# Patient Record
Sex: Female | Born: 1980
Health system: Southern US, Community
[De-identification: ages and names within clinical notes are randomized; demographics above are authoritative.]

## PROBLEM LIST (undated history)

## (undated) DIAGNOSIS — Z807 Family history of other malignant neoplasms of lymphoid, hematopoietic and related tissues: Secondary | ICD-10-CM

## (undated) DIAGNOSIS — Z803 Family history of malignant neoplasm of breast: Secondary | ICD-10-CM

## (undated) DIAGNOSIS — Z800 Family history of malignant neoplasm of digestive organs: Secondary | ICD-10-CM

## (undated) HISTORY — DX: Family history of malignant neoplasm of breast: Z80.3

## (undated) HISTORY — PX: WISDOM TOOTH EXTRACTION: SHX21

## (undated) HISTORY — DX: Family history of malignant neoplasm of digestive organs: Z80.0

## (undated) HISTORY — DX: Family history of other malignant neoplasms of lymphoid, hematopoietic and related tissues: Z80.7

---

## 2014-07-12 ENCOUNTER — Encounter: Payer: Self-pay | Admitting: Family Medicine

## 2014-07-12 ENCOUNTER — Ambulatory Visit (INDEPENDENT_AMBULATORY_CARE_PROVIDER_SITE_OTHER): Payer: BLUE CROSS/BLUE SHIELD | Admitting: Family Medicine

## 2014-07-12 VITALS — BP 100/60 | HR 103 | Temp 99.9°F | Wt 125.6 lb

## 2014-07-12 DIAGNOSIS — J0100 Acute maxillary sinusitis, unspecified: Secondary | ICD-10-CM

## 2014-07-12 MED ORDER — AMOXICILLIN 875 MG PO TABS
875.0000 mg | ORAL_TABLET | Freq: Two times a day (BID) | ORAL | Status: DC
Start: 2014-07-12 — End: 2014-08-09

## 2014-07-12 NOTE — Progress Notes (Signed)
   Subjective:    Patient ID: Alexis Carpenter, female    DOB: 12-02-1980, 34 y.o.   MRN: [id number]  HPI She complains of a one-month history of intermittent nasal and sinus congestion, PND. In the last several days she has had difficulty with fever, chills, myalgias, sore throat and worsening PND Of purulent drainage. She does not smoke and has no underlying allergies. She is breast-feeding. Her child is now 25 months old. The child has been bringing home viral URIs consistently since being in daycare   Review of Systems     Objective:   Physical Exam Alert and in no distress. Tympanic membranes and canals are normal. Pharyngeal area is normal.Nasal mucosa is red. Tender especially over her maxillary sinuses. Neck is supple without adenopathy or thyromegaly. Cardiac exam shows a regular sinus rhythm without murmurs or gallops. Lungs are clear to auscultation.         Assessment & Plan:  Acute maxillary sinusitis, recurrence not specified - Plan: amoxicillin (AMOXIL) 875 MG tablet Encouraged her to call me if not entirely better she finishes the anti-biotic.

## 2014-07-12 NOTE — Patient Instructions (Signed)
Take all the antibiotic and if not totally back to normal when you finish call me °

## 2014-07-13 ENCOUNTER — Ambulatory Visit: Payer: Self-pay | Admitting: Family Medicine

## 2014-07-14 ENCOUNTER — Encounter: Payer: Self-pay | Admitting: Family Medicine

## 2014-07-14 ENCOUNTER — Ambulatory Visit (INDEPENDENT_AMBULATORY_CARE_PROVIDER_SITE_OTHER): Payer: BLUE CROSS/BLUE SHIELD | Admitting: Family Medicine

## 2014-07-14 ENCOUNTER — Telehealth: Payer: Self-pay | Admitting: Family Medicine

## 2014-07-14 VITALS — BP 110/60 | Temp 98.8°F

## 2014-07-14 DIAGNOSIS — B084 Enteroviral vesicular stomatitis with exanthem: Secondary | ICD-10-CM

## 2014-07-14 NOTE — Telephone Encounter (Signed)
Pt was advised to take Benadryl pills for itching however she wants to know if she can use Benadryl cream instead so that she does not get drowsy

## 2014-07-14 NOTE — Telephone Encounter (Signed)
Pt called and stated that she came in on Tuesday and was given amoxicillin. She now believes that she has hand, foot and month disease. She wanted to know if she could take benadryl for the itching. JCL was consulted and he stated she could could take benadryl and amoxicillin together. Pt was advised.

## 2014-07-14 NOTE — Telephone Encounter (Signed)
Pt coming in for ov per Dr Redmond School

## 2014-07-14 NOTE — Patient Instructions (Addendum)
Continue with good hand washing and use Benadryl at night and either Claritin or Allegra during the day. Cold compresses for the areas that really itch

## 2014-07-14 NOTE — Progress Notes (Signed)
   Subjective:    Patient ID: Alexis Carpenter, female    DOB: May 08, 1980, 34 y.o.   MRN: [id number]  HPI She is here for evaluation of a rash. Her son apparently has hand-foot mouth disease and she developed lesions on her hands and to a lesser extent feet. They are causing some itching. She has also been on Amoxil for treatment of an underlying sinus infection.   Review of Systems     Objective:   Physical Exam Alert and in no distress. Exam of the oropharynx does show some erythematous lesions on the soft palate. She also has erythematous macular lesions on the palms and to a lesser extent on her soles. No other lesions noted.       Assessment & Plan:  Hand, foot and mouth disease Continue with good hand washing and use Benadryl at night and either Claritin or Allegra during the day. Cold compresses for the areas that really itch

## 2014-08-09 ENCOUNTER — Encounter: Payer: Self-pay | Admitting: Family Medicine

## 2014-08-09 ENCOUNTER — Ambulatory Visit (INDEPENDENT_AMBULATORY_CARE_PROVIDER_SITE_OTHER): Payer: BLUE CROSS/BLUE SHIELD | Admitting: Family Medicine

## 2014-08-09 VITALS — BP 110/60 | HR 66 | Temp 98.2°F | Wt 128.0 lb

## 2014-08-09 DIAGNOSIS — J0100 Acute maxillary sinusitis, unspecified: Secondary | ICD-10-CM | POA: Diagnosis not present

## 2014-08-09 DIAGNOSIS — R319 Hematuria, unspecified: Secondary | ICD-10-CM

## 2014-08-09 LAB — POCT URINALYSIS DIPSTICK
Bilirubin, UA: NEGATIVE
Glucose, UA: NEGATIVE
Ketones, UA: NEGATIVE
NITRITE UA: NEGATIVE
PROTEIN UA: NEGATIVE
Spec Grav, UA: 1.015
UROBILINOGEN UA: NEGATIVE
pH, UA: 6

## 2014-08-09 MED ORDER — AMOXICILLIN 875 MG PO TABS: 875.0000 mg | ORAL_TABLET | Freq: Two times a day (BID) | ORAL | Status: DC

## 2014-08-09 NOTE — Progress Notes (Signed)
   Subjective:    Patient ID: Alexis Carpenter, female    DOB: 09/15/1980, 34 y.o.   MRN: [id number]  HPI  He has a one-day history of urgency and frequency and this morning saw blood in her urine. No fever, chills, abdominal pain. She is breast-feeding.   Review of Systems     Objective:   Physical Exam Alert and in no distress. Urine dipstick was positive       Assessment & Plan:  Hematuria - Plan: POCT Urinalysis Dipstick  Acute UTI - Plan: amoxicillin (AMOXIL) 875 MG tablet  she is to return here in 10 days for repeat urinalysis with the nurse.

## 2014-08-23 ENCOUNTER — Other Ambulatory Visit: Payer: BLUE CROSS/BLUE SHIELD

## 2015-11-02 ENCOUNTER — Inpatient Hospital Stay (HOSPITAL_COMMUNITY): Payer: BLUE CROSS/BLUE SHIELD | Admitting: Anesthesiology

## 2015-11-02 ENCOUNTER — Encounter (HOSPITAL_COMMUNITY)
Admission: AD | Disposition: A | Discharge status: Home / Self Care | Payer: Self-pay | Source: Ambulatory Visit | Attending: Obstetrics and Gynecology

## 2015-11-02 ENCOUNTER — Encounter (HOSPITAL_COMMUNITY): Payer: Self-pay

## 2015-11-02 ENCOUNTER — Ambulatory Visit (HOSPITAL_COMMUNITY)
Admission: AD | Admit: 2015-11-02 | Discharge: 2015-11-02 | Disposition: A | Discharge status: Home / Self Care | Payer: BLUE CROSS/BLUE SHIELD | Source: Ambulatory Visit | Attending: Obstetrics and Gynecology | Admitting: Obstetrics and Gynecology

## 2015-11-02 ENCOUNTER — Ambulatory Visit: Admit: 2015-11-02 | Payer: Self-pay | Admitting: Obstetrics and Gynecology

## 2015-11-02 DIAGNOSIS — O021 Missed abortion: Secondary | ICD-10-CM | POA: Diagnosis present

## 2015-11-02 HISTORY — PX: DILATION AND EVACUATION: SHX1459

## 2015-11-02 LAB — CBC
HEMATOCRIT: 35.8 % — AB (ref 36.0–46.0)
HEMOGLOBIN: 12.3 g/dL (ref 12.0–15.0)
MCH: 30.4 pg (ref 26.0–34.0)
MCHC: 34.4 g/dL (ref 30.0–36.0)
MCV: 88.4 fL (ref 78.0–100.0)
Platelets: 217 10*3/uL (ref 150–400)
RBC: 4.05 MIL/uL (ref 3.87–5.11)
RDW: 12.1 % (ref 11.5–15.5)
WBC: 5.5 10*3/uL (ref 4.0–10.5)

## 2015-11-02 LAB — TYPE AND SCREEN
ABO/RH(D): AB POS
Antibody Screen: NEGATIVE

## 2015-11-02 LAB — ABO/RH: ABO/RH(D): AB POS

## 2015-11-02 SURGERY — DILATION AND EVACUATION, UTERUS
Anesthesia: Choice

## 2015-11-02 SURGERY — DILATION AND EVACUATION, UTERUS
Anesthesia: Monitor Anesthesia Care | Site: Vagina

## 2015-11-02 MED ORDER — KETOROLAC TROMETHAMINE 30 MG/ML IJ SOLN: 30.0000 mg | Freq: Once | INTRAMUSCULAR | Status: DC

## 2015-11-02 MED ORDER — FENTANYL CITRATE (PF) 100 MCG/2ML IJ SOLN
INTRAMUSCULAR | Status: DC | PRN
  Administered 2015-11-02: 100 ug via INTRAVENOUS

## 2015-11-02 MED ORDER — LACTATED RINGERS IV BOLUS (SEPSIS): 1000.0000 mL | Freq: Once | INTRAVENOUS | Status: DC

## 2015-11-02 MED ORDER — LIDOCAINE HCL 1 % IJ SOLN
INTRAMUSCULAR | Status: AC
  Filled 2015-11-02: qty 20

## 2015-11-02 MED ORDER — DEXTROSE 5 % IV SOLN
200.0000 mg | Freq: Once | INTRAVENOUS | Status: DC
  Filled 2015-11-02: qty 200

## 2015-11-02 MED ORDER — LACTATED RINGERS IV SOLN
INTRAVENOUS | Status: DC
  Administered 2015-11-02: 15:00:00 via INTRAVENOUS

## 2015-11-02 MED ORDER — FENTANYL CITRATE (PF) 100 MCG/2ML IJ SOLN: 25.0000 ug | INTRAMUSCULAR | Status: DC | PRN

## 2015-11-02 MED ORDER — FAMOTIDINE IN NACL 20-0.9 MG/50ML-% IV SOLN
20.0000 mg | Freq: Once | INTRAVENOUS | Status: AC
  Administered 2015-11-02: 20 mg via INTRAVENOUS
  Filled 2015-11-02: qty 50

## 2015-11-02 MED ORDER — MEPERIDINE HCL 25 MG/ML IJ SOLN: 6.2500 mg | INTRAMUSCULAR | Status: DC | PRN

## 2015-11-02 MED ORDER — DEXAMETHASONE SODIUM PHOSPHATE 10 MG/ML IJ SOLN
INTRAMUSCULAR | Status: DC | PRN
  Administered 2015-11-02: 4 mg via INTRAVENOUS

## 2015-11-02 MED ORDER — METOCLOPRAMIDE HCL 5 MG/ML IJ SOLN: 10.0000 mg | Freq: Once | INTRAMUSCULAR | Status: DC | PRN

## 2015-11-02 MED ORDER — PHENYLEPHRINE HCL 10 MG/ML IJ SOLN
INTRAMUSCULAR | Status: DC | PRN
  Administered 2015-11-02: 80 ug via INTRAVENOUS

## 2015-11-02 MED ORDER — SOD CITRATE-CITRIC ACID 500-334 MG/5ML PO SOLN
30.0000 mL | Freq: Once | ORAL | Status: AC
  Administered 2015-11-02: 30 mL via ORAL
  Filled 2015-11-02: qty 15

## 2015-11-02 MED ORDER — LACTATED RINGERS IV SOLN
INTRAVENOUS | Status: DC | PRN
Start: 2015-11-02 — End: 2015-11-02
  Administered 2015-11-02: 19:00:00 via INTRAVENOUS

## 2015-11-02 MED ORDER — MIDAZOLAM HCL 2 MG/2ML IJ SOLN
INTRAMUSCULAR | Status: AC
  Filled 2015-11-02: qty 2

## 2015-11-02 MED ORDER — DOXYCYCLINE HYCLATE 100 MG IV SOLR: 200.0000 mg | Freq: Once | INTRAVENOUS | Status: DC

## 2015-11-02 MED ORDER — KETOROLAC TROMETHAMINE 30 MG/ML IJ SOLN
INTRAMUSCULAR | Status: DC | PRN
  Administered 2015-11-02: 30 mg via INTRAVENOUS

## 2015-11-02 MED ORDER — LIDOCAINE HCL 1 % IJ SOLN
INTRAMUSCULAR | Status: DC | PRN
  Administered 2015-11-02: 10 mL

## 2015-11-02 MED ORDER — SCOPOLAMINE 1 MG/3DAYS TD PT72
MEDICATED_PATCH | TRANSDERMAL | Status: DC | PRN
  Administered 2015-11-02: 1 via TRANSDERMAL

## 2015-11-02 MED ORDER — FENTANYL CITRATE (PF) 100 MCG/2ML IJ SOLN
INTRAMUSCULAR | Status: AC
  Filled 2015-11-02: qty 2

## 2015-11-02 MED ORDER — MIDAZOLAM HCL 2 MG/2ML IJ SOLN
INTRAMUSCULAR | Status: DC | PRN
  Administered 2015-11-02: 2 mg via INTRAVENOUS

## 2015-11-02 MED ORDER — IBUPROFEN 600 MG PO TABS: 600.0000 mg | ORAL_TABLET | Freq: Four times a day (QID) | ORAL | 0 refills | Status: DC | PRN

## 2015-11-02 MED ORDER — HYDROCODONE-ACETAMINOPHEN 5-325 MG PO TABS
1.0000 | ORAL_TABLET | Freq: Four times a day (QID) | ORAL | 0 refills | Status: DC | PRN
Start: 2015-11-02 — End: 2016-08-02

## 2015-11-02 MED ORDER — DOXYCYCLINE HYCLATE 50 MG PO CAPS: 50.0000 mg | ORAL_CAPSULE | Freq: Two times a day (BID) | ORAL | 0 refills | Status: DC

## 2015-11-02 MED ORDER — LACTATED RINGERS IV SOLN: INTRAVENOUS | Status: DC

## 2015-11-02 SURGICAL SUPPLY — 18 items
CATH ROBINSON RED A/P 16FR (CATHETERS) ×2 IMPLANT
CLOTH BEACON ORANGE TIMEOUT ST (SAFETY) ×2 IMPLANT
CONTAINER PREFILL 10% NBF 60ML (FORM) ×4 IMPLANT
GLOVE BIO SURGEON STRL SZ 6.5 (GLOVE) ×2 IMPLANT
GLOVE BIOGEL PI IND STRL 7.0 (GLOVE) ×1 IMPLANT
GLOVE BIOGEL PI INDICATOR 7.0 (GLOVE) ×1
GOWN STRL REUS W/TWL LRG LVL3 (GOWN DISPOSABLE) ×4 IMPLANT
KIT BERKELEY 1ST TRIMESTER 3/8 (MISCELLANEOUS) ×2 IMPLANT
PACK VAGINAL MINOR WOMEN LF (CUSTOM PROCEDURE TRAY) ×2 IMPLANT
PAD OB MATERNITY 4.3X12.25 (PERSONAL CARE ITEMS) ×2 IMPLANT
PAD PREP 24X48 CUFFED NSTRL (MISCELLANEOUS) ×2 IMPLANT
SET BERKELEY SUCTION TUBING (SUCTIONS) ×2 IMPLANT
TOWEL OR 17X24 6PK STRL BLUE (TOWEL DISPOSABLE) ×4 IMPLANT
VACURETTE 10 RIGID CVD (CANNULA) IMPLANT
VACURETTE 7MM CVD STRL WRAP (CANNULA) ×2 IMPLANT
VACURETTE 8 RIGID CVD (CANNULA) IMPLANT
VACURETTE 9 RIGID CVD (CANNULA) IMPLANT
WATER STERILE IRR 1000ML POUR (IV SOLUTION) ×2 IMPLANT

## 2015-11-02 NOTE — Discharge Instructions (Signed)
Nothing in vagina for 6 weeks.  No sex, tampons, and douching.  Other instructions as in [employer].  Please do NOT take Motrin until after 4 am.  Remove Nausea Patch tomorrow if feeling ok. Wash HANDS!! Increase diet slowly.  Drink enough liquids to keep urine light yellow to clear.    Dilation and Curettage or Vacuum Curettage Dilation and curettage (D&C) and vacuum curettage are minor procedures. A D&C involves stretching (dilation) the cervix and scraping (curettage) the inside lining of the womb (uterus). During a D&C, tissue is gently scraped from the inside lining of the uterus. During a vacuum curettage, the lining and tissue in the uterus are removed with the use of gentle suction.  Curettage may be performed to either diagnose or treat a problem. As a diagnostic procedure, curettage is performed to examine tissues from the uterus. A diagnostic curettage may be performed for the following symptoms:   Irregular bleeding in the uterus.   Bleeding with the development of clots.   Spotting between menstrual periods.   Prolonged menstrual periods.   Bleeding after menopause.   No menstrual period (amenorrhea).   A change in size and shape of the uterus.  As a treatment procedure, curettage may be performed for the following reasons:   Removal of an IUD (intrauterine device).   Removal of retained placenta after giving birth. Retained placenta can cause an infection or bleeding severe enough to require transfusions.   Abortion.   Miscarriage.   Removal of polyps inside the uterus.   Removal of uncommon types of noncancerous lumps (fibroids).  LET [facility] CARE PROVIDER KNOW ABOUT:   Any allergies you have.   All medicines you are taking, including vitamins, herbs, eye drops, creams, and over-the-counter medicines.   Previous problems you or members of your family have had with the use of anesthetics.   Any blood disorders  you have.   Previous surgeries you have had.   Medical conditions you have. RISKS AND COMPLICATIONS  Generally, this is a safe procedure. However, as with any procedure, complications can occur. Possible complications include:  Excessive bleeding.   Infection of the uterus.   Damage to the cervix.   Development of scar tissue (adhesions) inside the uterus, later causing abnormal amounts of menstrual bleeding.   Complications from the general anesthetic, if a general anesthetic is used.   Putting a hole (perforation) in the uterus. This is rare.  BEFORE THE PROCEDURE   Eat and drink before the procedure only as directed by your health care provider.   Arrange for someone to take you home.  PROCEDURE  This procedure usually takes about 15-30 minutes.  You will be given one of the following:  A medicine that numbs the area in and around the cervix (local anesthetic).   A medicine to make you sleep through the procedure (general anesthetic).  You will lie on your back with your legs in stirrups.   A warm metal or plastic instrument (speculum) will be placed in your vagina to keep it open and to allow the health care provider to see the cervix.  There are two ways in which your cervix can be softened and dilated. These include:   Taking a medicine.   Having thin rods (laminaria) inserted into your cervix.   A curved tool (curette) will be used to scrape cells from the inside lining of the uterus. In some cases, gentle suction is applied with the curette. The curette will  then be removed.  AFTER THE PROCEDURE   You will rest in the recovery area until you are stable and are ready to go home.   You may feel sick to your stomach (nauseous) or throw up (vomit) if you were given a general anesthetic.   You may have a sore throat if a tube was placed in your throat during general anesthesia.   You may have light cramping and bleeding. This may last for 2  days to 2 weeks after the procedure.   Your uterus needs to make a new lining after the procedure. This may make your next period late.   This information is not intended to replace advice given to you by your health care provider. Make sure you discuss any questions you have with your health care provider.   Document Released: 12/24/2004 Document Revised: 08/26/2012 Document Reviewed: 07/23/2012 Elsevier Interactive Patient Education 2016 [place] Anesthesia, Adult, Care After Refer to this sheet in the next few weeks. These instructions provide you with information on caring for yourself after your procedure. Your health care provider may also give you more specific instructions. Your treatment has been planned according to current medical practices, but problems sometimes occur. Call your health care provider if you have any problems or questions after your procedure. WHAT TO EXPECT AFTER THE PROCEDURE After the procedure, it is typical to experience: Sleepiness. Nausea and vomiting. HOME CARE INSTRUCTIONS For the first 24 hours after general anesthesia: Have a responsible person with you. Do not drive a car. If you are alone, do not take public transportation. Do not drink alcohol. Do not take medicine that has not been prescribed by your health care provider. Do not sign important papers or make important decisions. You may resume a normal diet and activities as directed by your health care provider. Change bandages (dressings) as directed. If you have questions or problems that seem related to general anesthesia, call the hospital and ask for the anesthetist or anesthesiologist on call. SEEK MEDICAL CARE IF: You have nausea and vomiting that continue the day after anesthesia. You develop a rash. SEEK IMMEDIATE MEDICAL CARE IF:  You have difficulty breathing. You have chest pain. You have any allergic problems.   This information is not intended to  replace advice given to you by your health care provider. Make sure you discuss any questions you have with your health care provider.   Document Released: 04/01/2000 Document Revised: 01/14/2014 Document Reviewed: 04/24/2011 Elsevier Interactive Patient Education [employer].

## 2015-11-02 NOTE — Op Note (Signed)
Operative Note    Preoperative Diagnosis: missed ab at [redacted]wks ga confirmed x 2 by [place]   Postoperative Diagnosis: same   Procedure: Suction D&E   Surgeon: Dr Terri Piedra DO  Anesthesia: MAC  Fluids: LR 739ml EBL: 142ml UOP: 172ml   Findings: Anteverted uterus measuring 8-9cm   Specimen: Products of conception to pathology   Procedure Note Consent was verified with pt. Patient was taken to the operating room where LMA anesthesia was obtained without difficulty. She was then prepped and draped in the normal sterile fashion in the dorsal lithotomy position. An appropriate time out was performed. An exam under anesthesia noted an anteverted uterus.  A speculum was then placed within the vagina and the anterior lip of the cervix identified and grasped with a single toothed tenaculum. Uterus was then sounded to 8.5cm.  The Pratt dilators were utilized to dilate the cervix up to approximately 25. A size 7 curved cannula was then inserted into the uterine cavity and suction begun. There was moderate amount of products evacuated. After 4 passes, a sharp curettage was then performed. Four more passes with the suction cannula were done. Minimal amount of products were returned this time. A final currettage noted a gritty texture in all four quadrants.  Hence all instruments were removed from the vagina.  The tenaculum site was noted to be hemostatic.  There was scant to no bleeding from cervix. The speculum was removed from the vagina and the patient awakened and taken to the recovery room in good condition.  Counts correct per nursing staff

## 2015-11-02 NOTE — MAU Note (Signed)
Patient states she went to the office yesterday for an ultrasdound but baby had no heart beat so she was scheduled for a d&e today.

## 2015-11-02 NOTE — H&P (Addendum)
Alexis Carpenter is an 35 y.o. female G25P1011 who presented for ob confirmation appt yesterday in office and was noted to have a missed ab at [redacted]weeks ga ( should have been 9weeks 4 days per LMP) . Pt was counselled and given options of conservative mgmt vs cytotec for medical mgmt vs surgical mgmt via D&E. R/B or all options were reviewed and all questions answered. TP called today with decision to proceed with surgical intervention. Bedside US was done in MAU to re confirm no FHTs.. At this time she denies fever, chills, abdominal cramping or bleeding  Pertinent Gynecological History: B leeding: none DES exposure: denies Blood transfusions: none Sexually transmitted diseases: no past history Previous GYN Procedures: none  Last pap: normal Date: 02/2014 OB History: G2, P1011   Menstrual History: Menarche age: 87 No LMP recorded. Patient is pregnant.    History reviewed. No pertinent past medical history.  Past Surgical History:  Procedure Laterality Date  . CESAREAN SECTION    . WISDOM TOOTH EXTRACTION      History reviewed. No pertinent family history.  Social History:  reports that she has never smoked. She has never used smokeless tobacco. She reports that she does not drink alcohol or use drugs.  Allergies:  Allergies  Allergen Reactions  . Betadine [Povidone Iodine] Rash    Prescriptions Prior to Admission  Medication Sig Dispense Refill Last Dose  . amoxicillin (AMOXIL) 875 MG tablet Take 1 tablet (875 mg total) by mouth 2 (two) times daily. 20 tablet 0   . Prenatal Vit-Fe Fumarate-FA (PRENATAL MULTIVITAMIN) TABS tablet Take 1 tablet by mouth daily at 12 noon.   Taking    Review of Systems  Constitutional: Negative for chills, fever, malaise/fatigue and weight loss.  Eyes: Negative for blurred vision.  Respiratory: Negative for shortness of breath.   Cardiovascular: Negative for chest pain.  Gastrointestinal: Negative for abdominal pain, heartburn, nausea and  vomiting.  Genitourinary: Negative for dysuria.  Musculoskeletal: Negative for myalgias.  Skin: Negative for itching and rash.  Neurological: Negative for dizziness and headaches.  Psychiatric/Behavioral: Negative for depression, hallucinations and substance abuse. The patient is nervous/anxious.     Blood pressure 108/71, pulse 87, temperature 98.5 F (36.9 C), temperature source Oral, resp. rate 16. Physical Exam  Constitutional: She is oriented to person, place, and time. She appears well-developed and well-nourished.  HENT:  Head: Normocephalic.  Neck: Normal range of motion.  Cardiovascular: Normal rate.   Respiratory: Effort normal.  GI: Soft.  Genitourinary: Vagina normal and uterus normal.  Musculoskeletal: Normal range of motion.  Neurological: She is alert and oriented to person, place, and time.  Skin: Skin is warm and dry.  Psychiatric: She has a normal mood and affect. Her behavior is normal. Judgment and thought content normal.    Results for orders placed or performed during the hospital encounter of 11/02/15 (from the past 24 hour(s))  CBC     Status: Abnormal   Collection Time: 11/02/15  3:25 PM  Result Value Ref Range   WBC 5.5 4.0 - 10.5 K/uL   RBC 4.05 3.87 - 5.11 MIL/uL   Hemoglobin 12.3 12.0 - 15.0 g/dL   HCT 35.8 (L) 36.0 - 46.0 %   MCV 88.4 78.0 - 100.0 fL   MCH 30.4 26.0 - 34.0 pg   MCHC 34.4 30.0 - 36.0 g/dL   RDW 12.1 11.5 - 15.5 %   Platelets 217 150 - 400 K/uL  Type and screen [place]  Status: None   Collection Time: 11/02/15  3:48 PM  Result Value Ref Range   ABO/RH(D) AB POS    Antibody Screen NEG    Sample Expiration 11/05/2015     No results found.  Assessment/Plan: [id number] with 8weeks missed ab for Suction Dilation and evacuation Consent verified To OR when ready  Sherlyn Hay 11/02/2015, 6:52 PM

## 2015-11-02 NOTE — Anesthesia Procedure Notes (Signed)
Procedure Name: MAC Date/Time: 11/02/2015 6:57 PM Performed by: Fadia Marlar, Sheron Nightingale Pre-anesthesia Checklist: Patient identified, Emergency Drugs available, Patient being monitored and Timeout performed Patient Re-evaluated:Patient Re-evaluated prior to inductionOxygen Delivery Method: Nasal cannula Preoxygenation: Pre-oxygenation with 100% oxygen Intubation Type: IV induction

## 2015-11-02 NOTE — Anesthesia Postprocedure Evaluation (Signed)
Anesthesia Post Note  Patient: Alexis Carpenter  Procedure(s) Performed: Procedure(s) (LRB): DILATATION AND EVACUATION (N/A)  Patient location during evaluation: PACU Anesthesia Type: MAC Level of consciousness: awake and alert Pain management: pain level controlled Vital Signs Assessment: post-procedure vital signs reviewed and stable Respiratory status: spontaneous breathing, nonlabored ventilation, respiratory function stable and patient connected to nasal cannula oxygen Cardiovascular status: stable and blood pressure returned to baseline Anesthetic complications: no     Last Vitals:  Vitals:   11/02/15 1933 11/02/15 1945  BP: 109/60   Pulse: 96 86  Resp: 16 18  Temp: 36.8 C     Last Pain:  Vitals:   11/02/15 1933  TempSrc:   PainSc: 2    Pain Goal:                 [name]

## 2015-11-02 NOTE — Anesthesia Preprocedure Evaluation (Signed)
Anesthesia Evaluation  Patient identified by MRN, date of birth, ID band Patient awake    Reviewed: Allergy & Precautions, NPO status , Patient's Chart, lab work & pertinent test results  Airway Mallampati: II  TM Distance: >3 FB Neck ROM: Full    Dental no notable dental hx. (+) Caps   Pulmonary neg pulmonary ROS,    Pulmonary exam normal breath sounds clear to auscultation       Cardiovascular negative cardio ROS Normal cardiovascular exam Rhythm:Regular Rate:Normal     Neuro/Psych negative neurological ROS  negative psych ROS   GI/Hepatic negative GI ROS, Neg liver ROS,   Endo/Other  negative endocrine ROS  Renal/GU negative Renal ROS  negative genitourinary   Musculoskeletal negative musculoskeletal ROS (+)   Abdominal   Peds negative pediatric ROS (+)  Hematology negative hematology ROS (+)   Anesthesia Other Findings   Reproductive/Obstetrics negative OB ROS                             Anesthesia Physical Anesthesia Plan  ASA: II  Anesthesia Plan: MAC   Post-op Pain Management:    Induction:   Airway Management Planned: Natural Airway and Simple Face Mask  Additional Equipment:   Intra-op Plan:   Post-operative Plan:   Informed Consent: I have reviewed the patients History and Physical, chart, labs and discussed the procedure including the risks, benefits and alternatives for the proposed anesthesia with the patient or authorized representative who has indicated his/her understanding and acceptance.   Dental advisory given  Plan Discussed with: CRNA  Anesthesia Plan Comments:         Anesthesia Quick Evaluation

## 2015-11-02 NOTE — Transfer of Care (Signed)
Immediate Anesthesia Transfer of Care Note  Patient: Alexis Carpenter  Procedure(s) Performed: Procedure(s): DILATATION AND EVACUATION (N/A)  Patient Location: PACU  Anesthesia Type:MAC  Level of Consciousness: awake, alert  and oriented  Airway & Oxygen Therapy: Patient Spontanous Breathing and Patient connected to nasal cannula oxygen  Post-op Assessment: Report given to RN and Post -op Vital signs reviewed and stable  Post vital signs: Reviewed and stable  Last Vitals:  Vitals:   11/02/15 1526 11/02/15 1933  BP: 108/71   Pulse: 87 (P) 96  Resp: 16 (P) 16  Temp:  (P) 36.8 C    Last Pain:  Vitals:   11/02/15 1519  TempSrc: Oral         Complications: No apparent anesthesia complications

## 2015-11-03 ENCOUNTER — Encounter (HOSPITAL_COMMUNITY): Payer: Self-pay | Admitting: Obstetrics and Gynecology

## 2015-11-03 LAB — RPR: RPR Ser Ql: NONREACTIVE

## 2016-08-02 ENCOUNTER — Ambulatory Visit (INDEPENDENT_AMBULATORY_CARE_PROVIDER_SITE_OTHER): Payer: BLUE CROSS/BLUE SHIELD | Admitting: Medical

## 2016-08-02 ENCOUNTER — Encounter: Payer: Self-pay | Admitting: Medical

## 2016-08-02 VITALS — BP 130/80 | HR 79 | Resp 18 | Wt 147.4 lb

## 2016-08-02 DIAGNOSIS — M79605 Pain in left leg: Secondary | ICD-10-CM

## 2016-08-02 DIAGNOSIS — R0789 Other chest pain: Secondary | ICD-10-CM

## 2016-08-02 DIAGNOSIS — M62830 Muscle spasm of back: Secondary | ICD-10-CM | POA: Diagnosis not present

## 2016-08-02 DIAGNOSIS — M5432 Sciatica, left side: Secondary | ICD-10-CM

## 2016-08-02 NOTE — Progress Notes (Signed)
Subjective: Chief Complaint  Patient presents with  . Leg Pain    Reports sits alot at work and has shooting pain down her left leg.   . Chest Pain    worse with turning.    Here for few concerns.  Last visit 2016.    Been having some pains in her legs.  Pain is mostly left leg, but sometimes right leg too.  Pains are more regular, daily in recent months but has had some pains in left knee for 2 years.   Now pain is more throughout left leg, not confined to knee.  sometimes pain is left lateral leg, sometimes thigh.   No recent fall, injury.   Walks her dogs about 25 minutes daily.  Sits most of the day at work.  Recently though has started using stand up desk to help with pains.  She does tend to sit with legs crossed or left folded under her bottom, putting pressure on left buttocks at times . not stretching regularly.    No numbness or tingling in leg.    sometimes has back pain, but typically if sleeping in weird position.  She also notes few day hx/o pain in right upper back and right upper lateral chest.   She does reach back behind her to give her 2.5 yo son items when driving . Thinks she may have pulled a muscle.  No recent injury. She was going to massage therapy in the past, but just recently stopped doing this.     She notes no prior pains just after child birth 2.5 years ago.  No other aggravating or relieving factors. No other complaint.  No past medical history on file. No current outpatient prescriptions on file prior to visit.   No current facility-administered medications on file prior to visit.    ROS as in subjective   Objective: BP 130/80   Pulse 79   Resp 18   Wt 147 lb 6.4 oz (66.9 kg)   LMP 07/29/2016 (Exact Date)   SpO2 99%   Breastfeeding? Unknown   General appearance: alert, no distress, WD/WN,  Lean white female Tender in right upper back paraspinal and rhomboid region, +spasm,  Tender mildly in right upper lateral chest along pectoralis major.    mildly tender over left distal ITB otherwise arms and legs nontender, no mass, no deformity no swelling, normal ROM of ankles, hips knees Legs and arms neurovascularly intact Skin: no rash or skin lesion Back normal ROM, no pain reported Gait normal    Assessment: Encounter Diagnoses  Name Primary?  . Spasm of thoracic back muscle Yes  . Chest wall tenderness   . Left leg pain   . Sciatica of left side     Plan: Chest wall tendnerss and upper right back pain seems to be related to possible mild muscle strain and spasm of back muscles.   Advised stretching daily, can use short term heat, NSAID and massage therapy. F/u prn if not resolving within 2 weeks  Left leg pain most likely sciatic due to more direct pressure on left buttock when seated in asymmetrical positions throughout the day.   Advised not to sit cross legged for now, use the standup desk more, move around, stretch daily.   Can use NSAID for 1-2 weeks.   If not completley resolving or worse within 2 weeks, consider L spine xray, left knee xray, and physical therapy referral.  Asjah was seen today for leg pain and chest pain.  Diagnoses and all orders for this visit:  Spasm of thoracic back muscle  Chest wall tenderness  Left leg pain  Sciatica of left side

## 2016-08-26 ENCOUNTER — Telehealth: Payer: Self-pay | Admitting: Family Medicine

## 2016-08-26 DIAGNOSIS — M545 Low back pain, unspecified: Secondary | ICD-10-CM | POA: Insufficient documentation

## 2016-08-26 DIAGNOSIS — M5440 Lumbago with sciatica, unspecified side: Secondary | ICD-10-CM

## 2016-08-26 NOTE — Telephone Encounter (Signed)
Let her know that we ordered x-rays

## 2016-08-26 NOTE — Telephone Encounter (Signed)
Pt was notified and pt was given location to [place] [facility]

## 2016-08-26 NOTE — Telephone Encounter (Signed)
Pt still having leg pains. She said it was mentioned that at her last appointment that she may need xrays or some type of imaging of her back.

## 2016-08-29 ENCOUNTER — Ambulatory Visit
Admission: RE | Admit: 2016-08-29 | Discharge: 2016-08-29 | Disposition: A | Discharge status: Home / Self Care | Payer: BLUE CROSS/BLUE SHIELD | Source: Ambulatory Visit | Attending: Family Medicine | Admitting: Family Medicine

## 2016-08-29 DIAGNOSIS — M545 Low back pain: Secondary | ICD-10-CM

## 2016-09-04 ENCOUNTER — Encounter: Payer: Self-pay | Admitting: Medical

## 2016-09-04 ENCOUNTER — Ambulatory Visit (INDEPENDENT_AMBULATORY_CARE_PROVIDER_SITE_OTHER): Payer: BLUE CROSS/BLUE SHIELD | Admitting: Medical

## 2016-09-04 VITALS — BP 126/70 | HR 89 | Wt 148.0 lb

## 2016-09-04 DIAGNOSIS — M5432 Sciatica, left side: Secondary | ICD-10-CM | POA: Insufficient documentation

## 2016-09-04 DIAGNOSIS — M7630 Iliotibial band syndrome, unspecified leg: Secondary | ICD-10-CM

## 2016-09-04 DIAGNOSIS — Z9189 Other specified personal risk factors, not elsewhere classified: Secondary | ICD-10-CM

## 2016-09-04 DIAGNOSIS — M25562 Pain in left knee: Secondary | ICD-10-CM | POA: Diagnosis not present

## 2016-09-04 DIAGNOSIS — M79605 Pain in left leg: Secondary | ICD-10-CM

## 2016-09-04 DIAGNOSIS — M79604 Pain in right leg: Secondary | ICD-10-CM | POA: Diagnosis not present

## 2016-09-04 DIAGNOSIS — R0789 Other chest pain: Secondary | ICD-10-CM | POA: Diagnosis not present

## 2016-09-04 DIAGNOSIS — G8929 Other chronic pain: Secondary | ICD-10-CM | POA: Diagnosis not present

## 2016-09-04 DIAGNOSIS — E569 Vitamin deficiency, unspecified: Secondary | ICD-10-CM | POA: Diagnosis not present

## 2016-09-04 NOTE — Progress Notes (Signed)
Subjective: Chief Complaint  Patient presents with  . Follow-up    follow up from back pain ,still having pain , discuss x-ray    Here for recheck on leg pains and chest wall pain, spasm.  Since last visit doing Yoga, stretching daily, and has had improvement in leg pains.   She also got massage since last visit and the chest wall/spasm pain resolved  still getting the leg pains.   From last visit she noted, she had been having some pains in her legs.  Pain is mostly left leg, but sometimes right leg too.  Pains are more regular, daily in recent months but has had some pains in left knee for 2 years.   Now pain is more throughout left leg, not confined to knee.  sometimes pain is left lateral leg, sometimes thigh.   No recent fall, injury.   Walks her dogs about 25 minutes daily.  Sits most of the day at work.  Recently though has started using stand up desk to help with pains.  She was sitting with legs crossed or left folded under her bottom, putting pressure on left buttocks at times.  Has improved on this from last visit.  No numbness or tingling in leg.    sometimes has back pain, but typically if sleeping in weird position.  No prior injuries to back or leg.  No prior ortho surgery.     No other aggravating or relieving factors. No other complaint.  No past medical history on file. Current Outpatient Prescriptions on File Prior to Visit  Medication Sig Dispense Refill  . Prenatal Multivit-Min-Fe-FA (PRE-NATAL FORMULA PO) Pre-Natal     No current facility-administered medications on file prior to visit.    Past Surgical History:  Procedure Laterality Date  . CESAREAN SECTION    . DILATION AND EVACUATION N/A 11/02/2015   Procedure: DILATATION AND EVACUATION;  Surgeon: Sherlyn Hay, DO;  Location: [facility];  Service: Gynecology;  Laterality: N/A;  . WISDOM TOOTH EXTRACTION       ROS as in subjective   Objective: BP 126/70   Pulse 89   Wt 148 lb (67.1 kg)   SpO2 99%    General appearance: alert, no distress, WD/WN,  Lean white female Back nontender, normal ROM, no deformity  mildly tender over left distal ITB otherwise arms and legs nontender, no mass, no deformity no swelling, normal ROM of ankles, hips knees Legs and arms neurovascularly intact Skin: no rash or skin lesion Back normal ROM, no pain reported Gait normal     Assessment: Encounter Diagnoses  Name Primary?  . Leg pain, bilateral Yes  . Iliotibial band syndrome, unspecified laterality   . Chronic pain of left knee   . Sciatica of left side   . At risk for decreased bone density   . Vitamin deficiency   . Chest wall tenderness        Plan: She is improving, but not 100%.   We will refer to PT for additional eval and treatment for mild sciatica, IT band syndrome, left leg pain.  C/t Yoga and routine aerobic and weight bearing exercise.  She stopped sitting cross legged from last visit which has helped as well.  Discussed her concerns about osteoporosis.  She is a lean white female, possible prior abnormal bone density screen, possible vitamin deficiency on prior labs.  She will get me copy of lab panel from work in a month.  Consider returning for physical.  Of  note, she also sees OB/Gyn.  Osteoporosis questionnaire - risk factors include lean caucasian female with vit D deficiency  Chest wall tenderness, muscle spasm - resolved with stretching and massage therapy from last visit  Eydie was seen today for follow-up.  Diagnoses and all orders for this visit:  Leg pain, bilateral -     AMB referral to rehabilitation  Iliotibial band syndrome, unspecified laterality -     AMB referral to rehabilitation  Chronic pain of left knee -     AMB referral to rehabilitation  Sciatica of left side -     AMB referral to rehabilitation  At risk for decreased bone density -     DG Bone Density; Future  Vitamin deficiency -     DG Bone Density; Future  Chest wall  tenderness

## 2016-12-17 ENCOUNTER — Encounter: Payer: BLUE CROSS/BLUE SHIELD | Admitting: Family Medicine

## 2016-12-18 ENCOUNTER — Encounter: Payer: Self-pay | Admitting: Family Medicine

## 2016-12-18 ENCOUNTER — Ambulatory Visit (INDEPENDENT_AMBULATORY_CARE_PROVIDER_SITE_OTHER): Payer: BLUE CROSS/BLUE SHIELD | Admitting: Family Medicine

## 2016-12-18 VITALS — BP 120/78 | HR 97 | Temp 98.1°F | Ht 66.0 in | Wt 140.8 lb

## 2016-12-18 DIAGNOSIS — J209 Acute bronchitis, unspecified: Secondary | ICD-10-CM

## 2016-12-18 DIAGNOSIS — Z0000 Encounter for general adult medical examination without abnormal findings: Secondary | ICD-10-CM

## 2016-12-18 LAB — POCT URINALYSIS DIP (PROADVANTAGE DEVICE)
BILIRUBIN UA: NEGATIVE
BILIRUBIN UA: NEGATIVE mg/dL
Glucose, UA: NEGATIVE mg/dL
Leukocytes, UA: NEGATIVE
Nitrite, UA: NEGATIVE
PH UA: 6 (ref 5.0–8.0)
PROTEIN UA: NEGATIVE mg/dL
RBC UA: NEGATIVE
Specific Gravity, Urine: 1.01
Urobilinogen, Ur: NEGATIVE

## 2016-12-18 MED ORDER — AMOXICILLIN 875 MG PO TABS: 875.0000 mg | ORAL_TABLET | Freq: Two times a day (BID) | ORAL | 0 refills | Status: DC

## 2016-12-18 NOTE — Addendum Note (Signed)
Addended by: Caprice Beaver T on: 12/18/2016 01:19 PM   Modules accepted: Orders

## 2016-12-18 NOTE — Patient Instructions (Signed)
Call your OB and find out if you have had a tetanus shot

## 2016-12-18 NOTE — Progress Notes (Signed)
   Subjective:    Patient ID: Alexis Carpenter, female    DOB: [birth year], 36 y.o.   MRN: [id number]  HPI She is here for complete examination.  She has enjoyed excellent health and is followed by a gynecologist.  Presently she has had difficulty with a 2-week history of a dry cough and exposure through her child and her husband who is also had similar symptoms.  No sore throat, earache, nasal congestion, rhinorrhea, PND.  She does not smoke and does not have underlying allergies.  She also mentions that she could potentially be pregnant.  Family and social history as well as health maintenance and immunizations were reviewed.   Review of Systems  All other systems reviewed and are negative.      Objective:   Physical Exam BP 120/78 (BP Location: Right Arm, Patient Position: Sitting)   Pulse 97   Temp 98.1 F (36.7 C) (Oral)   Ht 5\' 6"  (1.676 m)   Wt 140 lb 12.8 oz (63.9 kg)   LMP 11/27/2016   SpO2 98%   BMI 22.73 kg/m   General Appearance:    Alert, cooperative, no distress, appears stated age  Head:    Normocephalic, without obvious abnormality, atraumatic  Eyes:    PERRL, conjunctiva/corneas clear, EOM's intact, fundi    benign  Ears:    Normal TM's and external ear canals  Nose:   Nares normal, mucosa normal, no drainage or sinus   tenderness  Throat:   Lips, mucosa, and tongue normal; teeth and gums normal  Neck:   Supple, no lymphadenopathy;  thyroid:  no   enlargement/tenderness/nodules; no carotid   bruit or JVD     Lungs:     Clear to auscultation bilaterally without wheezes, rales or     ronchi; respirations unlabored      Heart:    Regular rate and rhythm, S1 and S2 normal, no murmur, rub   or gallop  Breast Exam:    Deferred to GYN  Abdomen:     Soft, non-tender, nondistended, normoactive bowel sounds,    no masses, no hepatosplenomegaly  Genitalia:    Deferred to GYN     Extremities:   No clubbing, cyanosis or edema  Pulses:   2+ and symmetric all  extremities  Skin:   Skin color, texture, turgor normal, no rashes or lesions  Lymph nodes:   Cervical, supraclavicular, and axillary nodes normal  Neurologic:   CNII-XII intact, normal strength, sensation and gait; reflexes 2+ and symmetric throughout          Psych:   Normal mood, affect, hygiene and grooming.         Assessment & Plan:  Routine general medical examination at a health care facility - Plan: Comprehensive metabolic panel, CBC with Differential/Platelet, Lipid panel  Acute bronchitis, unspecified organism - Plan: amoxicillin (AMOXIL) 875 MG tablet I will treat her with Amoxil.  She is to call if not entirely better when she finishes the antibiotic.  Routine blood work drawn.  Did recommend she check with her gynecologist concerning her immunizations.  Discussed flu shot and she is not interested.

## 2016-12-19 LAB — CBC WITH DIFFERENTIAL/PLATELET
BASOS ABS: 30 {cells}/uL (ref 0–200)
BASOS PCT: 0.5 %
EOS ABS: 89 {cells}/uL (ref 15–500)
EOS PCT: 1.5 %
HCT: 40 % (ref 35.0–45.0)
Hemoglobin: 13.5 g/dL (ref 11.7–15.5)
LYMPHS ABS: 1611 {cells}/uL (ref 850–3900)
MCH: 30.7 pg (ref 27.0–33.0)
MCHC: 33.8 g/dL (ref 32.0–36.0)
MCV: 90.9 fL (ref 80.0–100.0)
MPV: 9.9 fL (ref 7.5–12.5)
Monocytes Relative: 4.7 %
NEUTROS ABS: 3894 {cells}/uL (ref 1500–7800)
Neutrophils Relative %: 66 %
PLATELETS: 267 10*3/uL (ref 140–400)
RBC: 4.4 10*6/uL (ref 3.80–5.10)
RDW: 11.4 % (ref 11.0–15.0)
Total Lymphocyte: 27.3 %
WBC mixed population: 277 cells/uL (ref 200–950)
WBC: 5.9 10*3/uL (ref 3.8–10.8)

## 2016-12-19 LAB — COMPREHENSIVE METABOLIC PANEL
AG RATIO: 2 (calc) (ref 1.0–2.5)
ALBUMIN MSPROF: 4.7 g/dL (ref 3.6–5.1)
ALT: 17 U/L (ref 6–29)
AST: 14 U/L (ref 10–30)
Alkaline phosphatase (APISO): 38 U/L (ref 33–115)
BUN: 9 mg/dL (ref 7–25)
CO2: 26 mmol/L (ref 20–32)
CREATININE: 0.69 mg/dL (ref 0.50–1.10)
Calcium: 9.2 mg/dL (ref 8.6–10.2)
Chloride: 102 mmol/L (ref 98–110)
GLOBULIN: 2.4 g/dL (ref 1.9–3.7)
GLUCOSE: 73 mg/dL (ref 65–99)
Potassium: 4.3 mmol/L (ref 3.5–5.3)
Sodium: 137 mmol/L (ref 135–146)
TOTAL PROTEIN: 7.1 g/dL (ref 6.1–8.1)
Total Bilirubin: 0.8 mg/dL (ref 0.2–1.2)

## 2016-12-19 LAB — LIPID PANEL
CHOL/HDL RATIO: 1.9 (calc) (ref <5.0)
Cholesterol: 151 mg/dL (ref <200)
HDL: 81 mg/dL (ref >50)
LDL Cholesterol (Calc): 61 mg/dL (calc)
NON-HDL CHOLESTEROL (CALC): 70 mg/dL (ref <130)
TRIGLYCERIDES: 33 mg/dL (ref <150)

## 2017-01-09 ENCOUNTER — Ambulatory Visit: Payer: BLUE CROSS/BLUE SHIELD | Admitting: Family Medicine

## 2017-01-09 ENCOUNTER — Ambulatory Visit (INDEPENDENT_AMBULATORY_CARE_PROVIDER_SITE_OTHER): Payer: BLUE CROSS/BLUE SHIELD | Admitting: Family Medicine

## 2017-01-09 ENCOUNTER — Encounter: Payer: Self-pay | Admitting: Family Medicine

## 2017-01-09 VITALS — BP 108/68 | HR 80 | Temp 99.0°F | Ht 66.0 in | Wt 142.8 lb

## 2017-01-09 DIAGNOSIS — R300 Dysuria: Secondary | ICD-10-CM

## 2017-01-09 DIAGNOSIS — N3001 Acute cystitis with hematuria: Secondary | ICD-10-CM | POA: Diagnosis not present

## 2017-01-09 DIAGNOSIS — J3489 Other specified disorders of nose and nasal sinuses: Secondary | ICD-10-CM | POA: Diagnosis not present

## 2017-01-09 LAB — POCT URINALYSIS DIP (PROADVANTAGE DEVICE)
BILIRUBIN UA: NEGATIVE
BILIRUBIN UA: NEGATIVE mg/dL
GLUCOSE UA: NEGATIVE mg/dL
Nitrite, UA: NEGATIVE
SPECIFIC GRAVITY, URINE: 1.02
Urobilinogen, Ur: NEGATIVE
pH, UA: 6 (ref 5.0–8.0)

## 2017-01-09 MED ORDER — NITROFURANTOIN MONOHYD MACRO 100 MG PO CAPS: 100.0000 mg | ORAL_CAPSULE | Freq: Two times a day (BID) | ORAL | 0 refills | Status: DC

## 2017-01-09 NOTE — Progress Notes (Signed)
Chief Complaint  Patient presents with  . Burning with urination    burning at the end of unination this morning. Trying to conceive and has had intercourse for the last 4 days.   . Facial Pain    also has been having sinus/pain pressure for the last few days, any recommendations as she has been using Flonase.   2 nights ago she woke up in the middle of the night to void, and it burned.  She had some urgency later that night as well.  The next day she had no symptoms. Today she had pain with the end of voiding.  Not having urgency or frequency, but continued to have pain at end of void each time she went to the bathroom today. Noticed slight blood tinge on the toilet paper after wiping this morning.  Denies vaginal discharge, odor, itch.  She is trying for pregnancy.  She is also complaining of sinus pressure. She and her husband have been sick for most of December. S/p amoxil.  She is complaining of head pressure x 6 days, moved down into her cheeks/teeth. Using Flonase. Denies discolored mucus (is mostly white) or fever. She hasn't tried sinus rinses, and hasn't taken decongestants recently.   PMH, PSH, SH reviewed  Outpatient Encounter Medications as of 01/09/2017  Medication Sig  . fluticasone (FLONASE) 50 MCG/ACT nasal spray Place into both nostrils daily.  . Prenatal Multivit-Min-Fe-FA (PRE-NATAL FORMULA PO) Pre-Natal  . letrozole (FEMARA) 2.5 MG tablet letrozole 2.5 mg tablet  take 1 tablet by mouth day as directed for 5 days (on days 5-9 of  . [DISCONTINUED] amoxicillin (AMOXIL) 875 MG tablet Take 1 tablet (875 mg total) by mouth 2 (two) times daily.   No facility-administered encounter medications on file as of 01/09/2017.    Allergies  Allergen Reactions  . Betadine [Povidone Iodine] Rash  . Latex     Itchy    ROS: No known fever or chills.  No flank pain or abdominal pain. No nausea, vomiting, diarrhea. No bleeding, bruising.  No rashes. URI symptoms per HPI.  Normal moods,  no chest pain, shortness of breath, cough or other concerns except as noted in HPI   PHYSICAL EXAM:  BP 108/68   Pulse 80   Temp 99 F (37.2 C)   Ht 5\' 6"  (1.676 m)   Wt 142 lb 12.8 oz (64.8 kg)   LMP 12/26/2016 (Exact Date)   BMI 23.05 kg/m   Well appearing, pleasant female in no distress HEENT: PERRL, EOMi, conjunctiva and sclera are clear. TM's and EAC's normal. Nasal mucosa is mildly edematous, no erythema or purulence, no drainage. Tender over maxillary sinuses bilatearlly OP is clear. Neck: no lymphadenopathy thyromegaly or mass Heart: regular rate and rhythm, no murmur Lungs: clear bilaterally Back: no spinal or CVA tenderness Abdomen: soft, nontender, no organomegaly or mass Extremities: no edema Psych: normal mood, affect, hygiene and grooming Neuro: alert and oriented, cranial nerves intact, normal gait Skin: normal turgor, no visible rash  Urine dip: 3+ leuks, mod blood   ASSESSMENT/PLAN:  Acute cystitis with hematuria - Plan: nitrofurantoin, macrocrystal-monohydrate, (MACROBID) 100 MG capsule  Burning with urination - Plan: POCT Urinalysis DIP (Proadvantage Device)  Sinus pain - supportive measures reviewed; no e/o infection--s/sx infection reviewed    Drink plenty of fluids. Take the antibiotic twice daily. You should notice improvement in urinary symptoms within 1-2 days.   Contact us if you develop flank pain, abdominal pain, blood in the urine, fever, chills, vomiting  or any side effects to the antibiotics.  As far as your sinus pain--try sudafed as well as sinus rinses. If there is thick mucus, you may want to add mucinex (I recommend the 12 hour plain) If you develop discolored mucus/phlegm, then you likely have a sinus infection that needs a different antibiotic. There is no evidence for a sinus infection today.

## 2017-01-09 NOTE — Patient Instructions (Addendum)
Drink plenty of fluids. Take the antibiotic twice daily. You should notice improvement in urinary symptoms within 1-2 days.   Contact us if you develop flank pain, abdominal pain, blood in the urine, fever, chills, vomiting or any side effects to the antibiotics.  As far as your sinus pain--try sudafed as well as sinus rinses. If there is thick mucus, you may want to add mucinex (I recommend the 12 hour plain) If you develop discolored mucus/phlegm, then you likely have a sinus infection that needs a different antibiotic. There is no evidence for a sinus infection today.    Urinary Tract Infection, Adult A urinary tract infection (UTI) is an infection of any part of the urinary tract. The urinary tract includes the:  Kidneys.  Ureters.  Bladder.  Urethra.  These organs make, store, and get rid of pee (urine) in the body. Follow these instructions at home:  Take over-the-counter and prescription medicines only as told by your doctor.  If you were prescribed an antibiotic medicine, take it as told by your doctor. Do not stop taking the antibiotic even if you start to feel better.  Avoid the following drinks: ? Alcohol. ? Caffeine. ? Tea. ? Carbonated drinks.  Drink enough fluid to keep your pee clear or pale yellow.  Keep all follow-up visits as told by your doctor. This is important.  Make sure to: ? Empty your bladder often and completely. Do not to hold pee for long periods of time. ? Empty your bladder before and after sex. ? Wipe from front to back after a bowel movement if you are female. Use each tissue one time when you wipe. Contact a doctor if:  You have back pain.  You have a fever.  You feel sick to your stomach (nauseous).  You throw up (vomit).  Your symptoms do not get better after 3 days.  Your symptoms go away and then come back. Get help right away if:  You have very bad back pain.  You have very bad lower belly (abdominal) pain.  You are  throwing up and cannot keep down any medicines or water. This information is not intended to replace advice given to you by your health care provider. Make sure you discuss any questions you have with your health care provider. Document Released: 06/12/2007 Document Revised: 06/01/2015 Document Reviewed: 11/14/2014 Elsevier Interactive Patient Education  [employer].

## 2017-01-15 ENCOUNTER — Telehealth: Payer: Self-pay | Admitting: Family Medicine

## 2017-01-15 NOTE — Telephone Encounter (Signed)
I have no clue what this gene is.  Find out more

## 2017-01-15 NOTE — Telephone Encounter (Signed)
Pt wanted referral for genetic testing because she has 2 cousins who were recently tested & had chek 2 gene & she is interested in being tested also.  And wants to know who you recommend?

## 2017-01-17 NOTE — Telephone Encounter (Signed)
[facility] called back and she will find out if they do that specific test and call back

## 2017-01-17 NOTE — Telephone Encounter (Signed)
Printed info and placed in your folder regarding Chek 2 and I called [facility] t# [phone] who does genetic counseling and left message for Seth Bake to return my call to see if they do this testing

## 2017-01-24 NOTE — Telephone Encounter (Signed)
Cancer center responded they do Chek 2 testing, left message for pt.  If she wants to go there, will need referral.

## 2017-01-27 NOTE — Telephone Encounter (Signed)
Left another message for pt.

## 2017-02-26 ENCOUNTER — Telehealth: Payer: Self-pay | Admitting: Family Medicine

## 2017-02-26 NOTE — Telephone Encounter (Signed)
Pt had left me and message and I called her back and she does want the referral to the Cancer center. Kim please do referral to [facility] center for Chek 2 testing, the person I have been speaking to there is Modena Morrow t# [phone]

## 2017-02-26 NOTE — Telephone Encounter (Signed)
Called the office of the cancer center  to schedule a visit. Left a message to call the office back to get it taken care of . I also will call the pt to see when she would be free to go . Thanks [employer]

## 2017-02-27 ENCOUNTER — Other Ambulatory Visit: Payer: Self-pay | Admitting: Family Medicine

## 2017-03-04 NOTE — Telephone Encounter (Signed)
Cancer center will call pt a schedule the check 2 testing . Thanks [employer]

## 2017-03-10 ENCOUNTER — Encounter: Payer: Self-pay | Admitting: Genetic Counselor

## 2017-03-10 ENCOUNTER — Telehealth: Payer: Self-pay | Admitting: Genetic Counselor

## 2017-03-10 NOTE — Telephone Encounter (Signed)
Genetic counseling appt has been scheduled for the pt to see Roma Kayser on 4/15 at 1pm. Pt aware to arrive 30 minutes early. Letter mailed.

## 2017-04-21 ENCOUNTER — Inpatient Hospital Stay: Payer: BLUE CROSS/BLUE SHIELD | Attending: Genetic Counselor | Admitting: Genetic Counselor

## 2017-04-21 ENCOUNTER — Inpatient Hospital Stay: Payer: BLUE CROSS/BLUE SHIELD

## 2017-04-21 DIAGNOSIS — Z315 Encounter for genetic counseling: Secondary | ICD-10-CM | POA: Diagnosis not present

## 2017-04-21 DIAGNOSIS — Z807 Family history of other malignant neoplasms of lymphoid, hematopoietic and related tissues: Secondary | ICD-10-CM | POA: Diagnosis not present

## 2017-04-21 DIAGNOSIS — Z808 Family history of malignant neoplasm of other organs or systems: Secondary | ICD-10-CM

## 2017-04-21 DIAGNOSIS — Z803 Family history of malignant neoplasm of breast: Secondary | ICD-10-CM | POA: Diagnosis not present

## 2017-04-21 DIAGNOSIS — Z800 Family history of malignant neoplasm of digestive organs: Secondary | ICD-10-CM | POA: Diagnosis not present

## 2017-04-22 ENCOUNTER — Encounter: Payer: Self-pay | Admitting: Genetic Counselor

## 2017-04-22 DIAGNOSIS — Z800 Family history of malignant neoplasm of digestive organs: Secondary | ICD-10-CM | POA: Insufficient documentation

## 2017-04-22 DIAGNOSIS — Z807 Family history of other malignant neoplasms of lymphoid, hematopoietic and related tissues: Secondary | ICD-10-CM | POA: Insufficient documentation

## 2017-04-22 DIAGNOSIS — Z803 Family history of malignant neoplasm of breast: Secondary | ICD-10-CM | POA: Insufficient documentation

## 2017-04-22 NOTE — Progress Notes (Signed)
REFERRING PROVIDER: Denita Lung, [address], [place] [address]  PRIMARY PROVIDER:  Denita Lung, MD  PRIMARY REASON FOR VISIT:  1. Family history of breast cancer   2. Family history of cancer of extrahepatic bile ducts   3. Family history of non-Hodgkin's lymphoma      HISTORY OF PRESENT ILLNESS:   Alexis Carpenter, a 37 y.o. female, was seen for a [place] cancer genetics consultation at the request of Dr. Redmond School due to a family history of cancer.  Alexis Carpenter presents to clinic today to discuss the possibility of a hereditary predisposition to cancer, genetic testing, and to further clarify her future cancer risks, as well as potential cancer risks for family members.   Alexis Carpenter is a 37 y.o. female with no personal history of cancer.  Three maternal cousins have tested positive for a CHEK2 familial mutation.  The patient is here today to undergo genetic testing and determine if her risk for cancer is elevated.  CANCER HISTORY:   No history exists.     HORMONAL RISK FACTORS:  Menarche was at age 28.  First live birth at age 29.  OCP use for approximately 9 years.  Ovaries intact: yes.  Hysterectomy: no.  Menopausal status: premenopausal.  HRT use: 0 years. Colonoscopy: no; not examined. Mammogram within the last year: no. Number of breast biopsies: 0. Up to date with pelvic exams:  yes. Any excessive radiation exposure in the past:  no  Past Medical History:  Diagnosis Date  . Family history of breast cancer   . Family history of cancer of extrahepatic bile ducts   . Family history of non-Hodgkin's lymphoma     Past Surgical History:  Procedure Laterality Date  . CESAREAN SECTION    . DILATION AND EVACUATION N/A 11/02/2015   Procedure: DILATATION AND EVACUATION;  Surgeon: Sherlyn Hay, DO;  Location: [facility];  Service: Gynecology;  Laterality: N/A;  . WISDOM TOOTH EXTRACTION      Social History   Socioeconomic History  .  Marital status: Married    Spouse name: Not on file  . Number of children: Not on file  . Years of education: Not on file  . Highest education level: Not on file  Occupational History  . Not on file  Social Needs  . Financial resource strain: Not on file  . Food insecurity:    Worry: Not on file    Inability: Not on file  . Transportation needs:    Medical: Not on file    Non-medical: Not on file  Tobacco Use  . Smoking status: Former Research scientist (life sciences)  . Smokeless tobacco: Never Used  Substance and Sexual Activity  . Alcohol use: Yes    Alcohol/week: 1.2 oz    Types: 2 Glasses of wine per week  . Drug use: No  . Sexual activity: Yes  Lifestyle  . Physical activity:    Days per week: Not on file    Minutes per session: Not on file  . Stress: Not on file  Relationships  . Social connections:    Talks on phone: Not on file    Gets together: Not on file    Attends religious service: Not on file    Active member of club or organization: Not on file    Attends meetings of clubs or organizations: Not on file    Relationship status: Not on file  Other Topics Concern  . Not on file  Social History  Narrative  . Not on file     FAMILY HISTORY:  We obtained a detailed, 4-generation family history.  Significant diagnoses are listed below: Family History  Problem Relation Age of Onset  . Lymphoma Maternal Aunt 16       Hodgkin's lymphoma  . Breast cancer Maternal Aunt 17  . Cancer Maternal Aunt        bile duct cancer dx in her 67s  . Heart attack Maternal Grandfather   . Lymphoma Paternal Grandmother        NHL  . Diabetes Paternal Grandfather   . Pneumonia Paternal Grandfather   . Lymphoma Brother 20       NHL  . Breast cancer Maternal Aunt        >50  . Lung cancer Maternal Aunt        mother's identical twin sister; smoker  . Cervical cancer Maternal Aunt   . Lymphoma Maternal Uncle        dx 40s-50s. NHL    The patient has one son who is cancer free.  She has a sister  and two brothers.  One brother was diagnosed with non-hodgkin's lymphoma at age 25 and died at 59.  Both parents are living.  The patient's mother is an identical twin.  Her twin is a smoker and was treated for lung cancer.  She is one of 11 children.  One sister had breast cancer over 108, one sister had cervical cancer, a third sister had hodgkin's lymphoma at 6, breast cancer at 31 and bile duct cancer in her 48's.  This sister's daughter underwent genetic testing was found to have a CHEK2 mutation.  Other family members with a CHEK2 mutation includes this cousin's sister, and the daughter of the patient's sister who had cervical cancer.  The maternal grandparents are deceased.  The grandmother died of old age and the grandfather from a heart attack.  The patient's father is alive and cancer free.  He has one brother and five sisters.  None have cancer.  His mother had non-hodgkin's lymphoma in her late 73's.  His father died of diabetes and pneumonia.  Patient's maternal ancestors are of [place] descent, and paternal ancestors are of [place] descent. There is no reported Ashkenazi Jewish ancestry. There is no known consanguinity.  GENETIC COUNSELING ASSESSMENT: Alexis Carpenter is a 37 y.o. female with a family history of cancer and a known family mutation in [place] which is somewhat suggestive of a hereditary cancer syndrome and predisposition to cancer. We, therefore, discussed and recommended the following at today's visit.   DISCUSSION: We discussed that about 5-10% of breast cancer is hereditary with most cases due to BRCA mutations.  The patient's maternal cousin underwent genetic testing and was found to have a CHEK2 mutation.  Subsequently this cousin's sister was also found to carry this mutation, and another maternal cousin was found to carry this.  We discussed that since 3 cousins from two branches of her mother's family have tested positive for the CHEK2 mutation, we can assume that her  mother's siblings are carriers, and that the CHEK2 mutation did not come from another side of the family.  Therefore, based on Alexis Carpenter's place in the family, she is at a 25% chance of having the CHEK2 mutation.  We discussed that if her mother underwent genetic testing, then that would allow the patient and her siblings to know whether they need to undergo genetic testing.  In theory it could also let  her twin sister know if she is a carrier and if her children need to undergo testing.  The patient indicated that her mother did not have an interest in undergoing testing.  We also discussed that should the patient test positive, that we have essentially tested her mother and her mother's twin sister.  The patient voiced her understanding.  We discussed that Lymphoma is not typically considered a hereditary cancer.  She has several family members, on both side of the family, with both non-hodgkin's and hodgkin's lymphoma.  We will look further into this.  We reviewed the characteristics, features and inheritance patterns of hereditary cancer syndromes. We also discussed genetic testing, including the appropriate family members to test, the process of testing, insurance coverage and turn-around-time for results. We discussed the implications of a negative, positive and/or variant of uncertain significant result. We recommended Alexis Carpenter pursue genetic testing for the common hereditary cancer gene panel. The Hereditary Gene Panel offered by [facility] includes sequencing and/or deletion duplication testing of the following 47 genes: APC, ATM, AXIN2, BARD1, BMPR1A, BRCA1, BRCA2, BRIP1, CDH1, CDK4, CDKN2A (p14ARF), CDKN2A (p16INK4a), CHEK2, CTNNA1, DICER1, EPCAM (Deletion/duplication testing only), GREM1 (promoter region deletion/duplication testing only), KIT, MEN1, MLH1, MSH2, MSH3, MSH6, MUTYH, NBN, NF1, NHTL1, PALB2, PDGFRA, PMS2, POLD1, POLE, PTEN, RAD50, RAD51C, RAD51D, SDHB, SDHC, SDHD, SMAD4, SMARCA4. STK11,  TP53, TSC1, TSC2, and VHL.  The following genes were evaluated for sequence changes only: SDHA and HOXB13 c.251G>A variant only.   Based on Alexis Carpenter's family history of cancer, she meets medical criteria for genetic testing. Despite that she meets criteria, she may still have an out of pocket cost. We discussed that if her out of pocket cost for testing is over $100, the laboratory will call and confirm whether she wants to proceed with testing.  If the out of pocket cost of testing is less than $100 she will be billed by the genetic testing laboratory.   PLAN: After considering the risks, benefits, and limitations, Alexis Carpenter  provided informed consent to pursue genetic testing and the blood sample was sent to [facility] for analysis of the Common Hereditary Cancer Panel. Results should be available within approximately 2-4 weeks' time, at which point they will be disclosed by telephone to Alexis Carpenter, as will any additional recommendations warranted by these results. Alexis Carpenter will receive a summary of her genetic counseling visit and a copy of her results once available. This information will also be available in Epic. We encouraged Alexis Carpenter to remain in contact with cancer genetics annually so that we can continuously update the family history and inform her of any changes in cancer genetics and testing that may be of benefit for her family. Alexis Carpenter questions were answered to her satisfaction today. Our contact information was provided should additional questions or concerns arise.  Lastly, we encouraged Alexis Carpenter to remain in contact with cancer genetics annually so that we can continuously update the family history and inform her of any changes in cancer genetics and testing that may be of benefit for this family.   Ms.  Carpenter questions were answered to her satisfaction today. Our contact information was provided should additional questions or concerns arise. Thank you for the  referral and allowing [place] to share in the care of your patient.   Karen P. Florene Glen, [place], [facility] Certified Genetic Counselor [email or web address] phone: [phone]  The patient was seen for a total of 45 minutes in face-to-face genetic counseling.  This patient was  discussed with Drs. Magrinat, Lindi Adie and/or Burr Medico who agrees with the above.    _______________________________________________________________________ For Office Staff:  Number of people involved in session: 1 Was an Intern/ student involved with case: no

## 2017-05-01 ENCOUNTER — Telehealth: Payer: Self-pay | Admitting: Genetic Counselor

## 2017-05-01 ENCOUNTER — Encounter: Payer: Self-pay | Admitting: Genetic Counselor

## 2017-05-01 ENCOUNTER — Ambulatory Visit: Payer: Self-pay | Admitting: Genetic Counselor

## 2017-05-01 DIAGNOSIS — Z1379 Encounter for other screening for genetic and chromosomal anomalies: Secondary | ICD-10-CM | POA: Insufficient documentation

## 2017-05-01 DIAGNOSIS — Z800 Family history of malignant neoplasm of digestive organs: Secondary | ICD-10-CM

## 2017-05-01 DIAGNOSIS — Z803 Family history of malignant neoplasm of breast: Secondary | ICD-10-CM

## 2017-05-01 DIAGNOSIS — Z807 Family history of other malignant neoplasms of lymphoid, hematopoietic and related tissues: Secondary | ICD-10-CM

## 2017-05-01 NOTE — Telephone Encounter (Signed)
Revealed negative genetic testing.  She does not have the CHEK2 familial mutation.  She is placed at high risk for breast cancer, despite this negative genetic testing.  Tyrer Cusik places her at 19.5%.

## 2017-05-01 NOTE — Progress Notes (Signed)
HPI:  Ms. Mcfayden was previously seen in the [facility] due to a family history of cancer, a familial CHEK2 mutation, and concerns regarding a hereditary predisposition to cancer. Please refer to our prior cancer genetics clinic note for more information regarding Ms. Curto's medical, social and family histories, and our assessment and recommendations, at the time. Ms. Marian recent genetic test results were disclosed to her, as were recommendations warranted by these results. These results and recommendations are discussed in more detail below.  CANCER HISTORY:   No history exists.    FAMILY HISTORY:  We obtained a detailed, 4-generation family history.  Significant diagnoses are listed below: Family History  Problem Relation Age of Onset  . Lymphoma Maternal Aunt 16       Hodgkin's lymphoma  . Breast cancer Maternal Aunt 65  . Cancer Maternal Aunt        bile duct cancer dx in her 56s  . Heart attack Maternal Grandfather   . Lymphoma Paternal Grandmother        NHL  . Diabetes Paternal Grandfather   . Pneumonia Paternal Grandfather   . Lymphoma Brother 20       NHL  . Breast cancer Maternal Aunt        >50  . Lung cancer Maternal Aunt        mother's identical twin sister; smoker  . Cervical cancer Maternal Aunt   . Lymphoma Maternal Uncle        dx 40s-50s. NHL    The patient has one son who is cancer free.  She has a sister and two brothers.  One brother was diagnosed with non-hodgkin's lymphoma at age 12 and died at 56.  Both parents are living.  The patient's mother is an identical twin.  Her twin is a smoker and was treated for lung cancer.  She is one of 11 children.  One sister had breast cancer over 50, one sister had cervical cancer, a third sister had hodgkin's lymphoma at 64, breast cancer at 81 and bile duct cancer in her 77's.  This sister's daughter underwent genetic testing was found to have a CHEK2 mutation.  Other family members with a  CHEK2 mutation includes this cousin's sister, and the daughter of the patient's sister who had cervical cancer.  The maternal grandparents are deceased.  The grandmother died of old age and the grandfather from a heart attack.  The patient's father is alive and cancer free.  He has one brother and five sisters.  None have cancer.  His mother had non-hodgkin's lymphoma in her late 77's.  His father died of diabetes and pneumonia.  Patient's maternal ancestors are of [place] descent, and paternal ancestors are of [place] descent. There is no reported Ashkenazi Jewish ancestry. There is no known consanguinity.  GENETIC TEST RESULTS: Genetic testing reported out on April 30, 2017 through the common hereditary cancer panel found no deleterious mutations.  Specifically, testing did not reveal the familial mutation. We call this result a true negative result because the cancer-causing mutation was identified in Ms. Wimes's family, and she did not inherit it. The Hereditary Gene Panel offered by [facility] includes sequencing and/or deletion duplication testing of the following 47 genes: APC, ATM, AXIN2, BARD1, BMPR1A, BRCA1, BRCA2, BRIP1, CDH1, CDK4, CDKN2A (p14ARF), CDKN2A (p16INK4a), CHEK2, CTNNA1, DICER1, EPCAM (Deletion/duplication testing only), GREM1 (promoter region deletion/duplication testing only), KIT, MEN1, MLH1, MSH2, MSH3, MSH6, MUTYH, NBN, NF1, NHTL1, PALB2, PDGFRA, PMS2, POLD1, POLE, PTEN,  RAD50, RAD51C, RAD51D, SDHB, SDHC, SDHD, SMAD4, SMARCA4. STK11, TP53, TSC1, TSC2, and VHL.  The following genes were evaluated for sequence changes only: SDHA and HOXB13 c.251G>A variant only. The test report has been scanned into EPIC and is located under the Molecular Pathology section of the Results Review tab.    We discussed with Ms. Obenchain that since the current genetic testing is not perfect, it is possible there may be a gene mutation in one of these genes that current testing cannot detect, but that chance  is small.  We also discussed, that it is possible that another gene that has not yet been discovered, or that we have not yet tested, is responsible for the cancer diagnoses in the family, and it is, therefore, important to remain in touch with cancer genetics in the future so that we can continue to offer Ms. Buren the most up to date genetic testing.   CANCER SCREENING RECOMMENDATIONS:  This normal result is reassuring and indicates that Ms. Henney does not likely have an increased risk of cancer due to a mutation in one of these genes.  We, therefore, recommended  Ms. Quesenberry continue to follow the cancer screening guidelines provided by her primary healthcare providers.   An individual's cancer risk and medical management are not determined by genetic test results alone. Overall cancer risk assessment incorporates additional factors, including personal medical history, family history, and any available genetic information that may result in a personalized plan for cancer prevention and surveillance.  Based on the Ms. Spring's personal and family history of cancer, as well as her genetic test results, statistical models (Tyrer Cusik)  and literature data were used to estimate her risk of developing breast cancer. These estimate her lifetime risk of developing breast cancer to be approximately 19.5%.  The patient's lifetime breast cancer risk is a preliminary estimate based on available information using one of several models endorsed by the [place] (ACS). The ACS recommends consideration of breast MRI screening as an adjunct to mammography for patients at high risk (defined as 20% or greater lifetime risk). A more detailed breast cancer risk assessment can be considered, if clinically indicated.   Ms. Burling has been determined to be just under the high risk cut-off for breast cancer.  Therefore, we recommend having a discussion with her physician about the best way to manage her risk.   This could include a baseline mammogram, annual screening with mammography and/or breast MR, or other screening management.  We discussed that Ms. Violett should discuss her individual situation with her referring physician and determine a breast cancer screening plan with which they are both comfortable.      RECOMMENDATIONS FOR FAMILY MEMBERS:  Women in this family might be at some increased risk of developing cancer, over the general population risk, simply due to the family history of cancer.  We recommended women in this family have a yearly mammogram beginning at age 78, or 74 years younger than the earliest onset of cancer, an annual clinical breast exam, and perform monthly breast self-exams. Women in this family should also have a gynecological exam as recommended by their primary provider. All family members should have a colonoscopy by age 61.  Based on Ms. Hark's family history, we recommended her mother have genetic counseling and testing. Ms. Rominger will let us know if we can be of any assistance in coordinating genetic counseling and/or testing for this family member.   FOLLOW-UP: Lastly, we discussed with Ms.  Oka that cancer genetics is a rapidly advancing field and it is possible that new genetic tests will be appropriate for her and/or her family members in the future. We encouraged her to remain in contact with cancer genetics on an annual basis so we can update her personal and family histories and let her know of advances in cancer genetics that may benefit this family.   Our contact number was provided. Ms. Frazzini questions were answered to her satisfaction, and she knows she is welcome to call us at anytime with additional questions or concerns.   Roma Kayser, MS, [facility] Certified Genetic Counselor [email or web address]

## 2017-05-16 LAB — RESULTS CONSOLE HPV: CHL HPV: NEGATIVE

## 2017-12-19 ENCOUNTER — Ambulatory Visit (INDEPENDENT_AMBULATORY_CARE_PROVIDER_SITE_OTHER): Payer: BLUE CROSS/BLUE SHIELD | Admitting: Family Medicine

## 2017-12-19 ENCOUNTER — Encounter: Payer: Self-pay | Admitting: Family Medicine

## 2017-12-19 VITALS — BP 104/70 | HR 74 | Temp 97.9°F | Ht 65.5 in | Wt 148.8 lb

## 2017-12-19 DIAGNOSIS — Z3490 Encounter for supervision of normal pregnancy, unspecified, unspecified trimester: Secondary | ICD-10-CM

## 2017-12-19 DIAGNOSIS — J309 Allergic rhinitis, unspecified: Secondary | ICD-10-CM

## 2017-12-19 DIAGNOSIS — Z803 Family history of malignant neoplasm of breast: Secondary | ICD-10-CM

## 2017-12-19 DIAGNOSIS — Z0000 Encounter for general adult medical examination without abnormal findings: Secondary | ICD-10-CM

## 2017-12-19 DIAGNOSIS — Z800 Family history of malignant neoplasm of digestive organs: Secondary | ICD-10-CM | POA: Diagnosis not present

## 2017-12-19 DIAGNOSIS — Z331 Pregnant state, incidental: Secondary | ICD-10-CM

## 2017-12-19 LAB — POCT URINALYSIS DIP (PROADVANTAGE DEVICE)
BILIRUBIN UA: NEGATIVE
Blood, UA: NEGATIVE
Glucose, UA: NEGATIVE mg/dL
Ketones, POC UA: NEGATIVE mg/dL
Leukocytes, UA: NEGATIVE
Nitrite, UA: NEGATIVE
Protein Ur, POC: NEGATIVE mg/dL
Specific Gravity, Urine: 1.015
Urobilinogen, Ur: 3.5
pH, UA: 7.5 (ref 5.0–8.0)

## 2017-12-19 NOTE — Progress Notes (Signed)
   Subjective:    Patient ID: Alexis Carpenter, female    DOB: 1980/07/15, 37 y.o.   MRN: [id number]  HPI She is here for complete examination.  She was recently seen by her OB/GYN and found to be pregnant.  This will be her second child and she is quite happy about this.  She does have underlying allergies but is having no difficulties with that at the present time.  She does not smoke.  Family and social history as well as health maintenance and immunizations was reviewed.   Review of Systems     Objective:   Physical Exam Alert and in no distress. Tympanic membranes and canals are normal. Pharyngeal area is normal. Neck is supple without adenopathy or thyromegaly. Cardiac exam shows a regular sinus rhythm without murmurs or gallops. Lungs are clear to auscultation.        Assessment & Plan:  Routine medical exam - Plan: POCT Urinalysis DIP (Proadvantage Device), CBC with Differential/Platelet, Comprehensive metabolic panel, Lipid panel  IUP (intrauterine pregnancy), incidental  Family history of breast cancer  Family history of cancer of extrahepatic bile ducts  Allergic rhinitis, unspecified seasonality, unspecified trigger  She will be followed by OB/GYN.  We discussed a family history of breast cancer as well as other types of cancers.  She is well aware of this and plans to continue to monitor this.

## 2017-12-20 LAB — COMPREHENSIVE METABOLIC PANEL
ALT: 16 IU/L (ref 0–32)
AST: 11 IU/L (ref 0–40)
Albumin/Globulin Ratio: 1.9 (ref 1.2–2.2)
Albumin: 4 g/dL (ref 3.5–5.5)
Alkaline Phosphatase: 43 IU/L (ref 39–117)
BUN/Creatinine Ratio: 11 (ref 9–23)
BUN: 6 mg/dL (ref 6–20)
Bilirubin Total: 0.3 mg/dL (ref 0.0–1.2)
CO2: 23 mmol/L (ref 20–29)
Calcium: 8.5 mg/dL — ABNORMAL LOW (ref 8.7–10.2)
Chloride: 101 mmol/L (ref 96–106)
Creatinine, Ser: 0.54 mg/dL — ABNORMAL LOW (ref 0.57–1.00)
GFR calc non Af Amer: 122 mL/min/{1.73_m2} (ref >59)
GFR, EST AFRICAN AMERICAN: 140 mL/min/{1.73_m2} (ref >59)
Globulin, Total: 2.1 g/dL (ref 1.5–4.5)
Glucose: 73 mg/dL (ref 65–99)
Potassium: 4.5 mmol/L (ref 3.5–5.2)
Sodium: 136 mmol/L (ref 134–144)
TOTAL PROTEIN: 6.1 g/dL (ref 6.0–8.5)

## 2017-12-20 LAB — CBC WITH DIFFERENTIAL/PLATELET
BASOS: 1 %
Basophils Absolute: 0 10*3/uL (ref 0.0–0.2)
EOS (ABSOLUTE): 0.1 10*3/uL (ref 0.0–0.4)
Eos: 2 %
Hematocrit: 36 % (ref 34.0–46.6)
Hemoglobin: 12 g/dL (ref 11.1–15.9)
IMMATURE GRANS (ABS): 0 10*3/uL (ref 0.0–0.1)
Immature Granulocytes: 0 %
LYMPHS: 25 %
Lymphocytes Absolute: 1.6 10*3/uL (ref 0.7–3.1)
MCH: 29.9 pg (ref 26.6–33.0)
MCHC: 33.3 g/dL (ref 31.5–35.7)
MCV: 90 fL (ref 79–97)
Monocytes Absolute: 0.3 10*3/uL (ref 0.1–0.9)
Monocytes: 4 %
NEUTROS ABS: 4.5 10*3/uL (ref 1.4–7.0)
Neutrophils: 68 %
Platelets: 263 10*3/uL (ref 150–450)
RBC: 4.02 x10E6/uL (ref 3.77–5.28)
RDW: 11.6 % — ABNORMAL LOW (ref 12.3–15.4)
WBC: 6.6 10*3/uL (ref 3.4–10.8)

## 2017-12-20 LAB — LIPID PANEL
Chol/HDL Ratio: 2.3 ratio (ref 0.0–4.4)
Cholesterol, Total: 168 mg/dL (ref 100–199)
HDL: 72 mg/dL (ref >39)
LDL Calculated: 86 mg/dL (ref 0–99)
Triglycerides: 52 mg/dL (ref 0–149)
VLDL CHOLESTEROL CAL: 10 mg/dL (ref 5–40)

## 2017-12-26 LAB — OB RESULTS CONSOLE GC/CHLAMYDIA
Chlamydia: NEGATIVE
Chlamydia: NEGATIVE
Gonorrhea: NEGATIVE
Gonorrhea: NEGATIVE

## 2017-12-26 LAB — OB RESULTS CONSOLE ABO/RH: RH Type: POSITIVE

## 2017-12-26 LAB — OB RESULTS CONSOLE RPR
RPR: NONREACTIVE
RPR: NONREACTIVE

## 2017-12-26 LAB — OB RESULTS CONSOLE HIV ANTIBODY (ROUTINE TESTING)
HIV: NONREACTIVE
HIV: NONREACTIVE

## 2017-12-26 LAB — OB RESULTS CONSOLE HEPATITIS B SURFACE ANTIGEN
Hepatitis B Surface Ag: NEGATIVE
Hepatitis B Surface Ag: NEGATIVE

## 2017-12-26 LAB — OB RESULTS CONSOLE RUBELLA ANTIBODY, IGM
Rubella: IMMUNE
Rubella: IMMUNE

## 2018-03-13 ENCOUNTER — Encounter: Payer: Self-pay | Admitting: Family Medicine

## 2018-03-13 ENCOUNTER — Ambulatory Visit (INDEPENDENT_AMBULATORY_CARE_PROVIDER_SITE_OTHER): Payer: BLUE CROSS/BLUE SHIELD | Admitting: Family Medicine

## 2018-03-13 VITALS — BP 110/74 | HR 97 | Temp 98.2°F | Wt 159.8 lb

## 2018-03-13 DIAGNOSIS — Z331 Pregnant state, incidental: Secondary | ICD-10-CM

## 2018-03-13 DIAGNOSIS — Z3490 Encounter for supervision of normal pregnancy, unspecified, unspecified trimester: Secondary | ICD-10-CM | POA: Diagnosis not present

## 2018-03-13 DIAGNOSIS — J111 Influenza due to unidentified influenza virus with other respiratory manifestations: Secondary | ICD-10-CM | POA: Diagnosis not present

## 2018-03-13 DIAGNOSIS — R5383 Other fatigue: Secondary | ICD-10-CM | POA: Diagnosis not present

## 2018-03-13 LAB — POCT INFLUENZA A/B
Influenza A, POC: NEGATIVE
Influenza B, POC: NEGATIVE

## 2018-03-13 NOTE — Progress Notes (Signed)
   Subjective:    Patient ID: Alexis Carpenter, female    DOB: 04-16-1980, 38 y.o.   MRN: [id number]  HPI She has a 4-day history that started with nasal congestion, sore throat, fatigue and slight cough.  She has been exposed to the flu from her mother-in-law from a recent trip.  She is pregnant.   Review of Systems     Objective:   Physical Exam Alert and in no distress. Tympanic membranes and canals are normal. Pharyngeal area is normal. Neck is supple without adenopathy or thyromegaly. Cardiac exam shows a regular sinus rhythm without murmurs or gallops. Lungs are clear to auscultation. Flu test negative      Assessment & Plan:   Fatigue, unspecified type - Plan: Influenza A/B  IUP (intrauterine pregnancy), incidental  Flu syndrome Recommend supportive care with plenty of fluids, Tylenol and decongestant as per OB/GYN.  If her symptoms worsen, she will call.

## 2018-06-20 IMAGING — DX DG LUMBAR SPINE COMPLETE 4+V
5 series · 5 of 5 positions shown · non-contrast
Comparison: None.

CLINICAL DATA: Low back pain radiating into bilateral legs for 6
months

EXAM:
LUMBAR SPINE - COMPLETE 4+ VIEW

[dg lumbar spine complete 4 +v (1 of 5)]
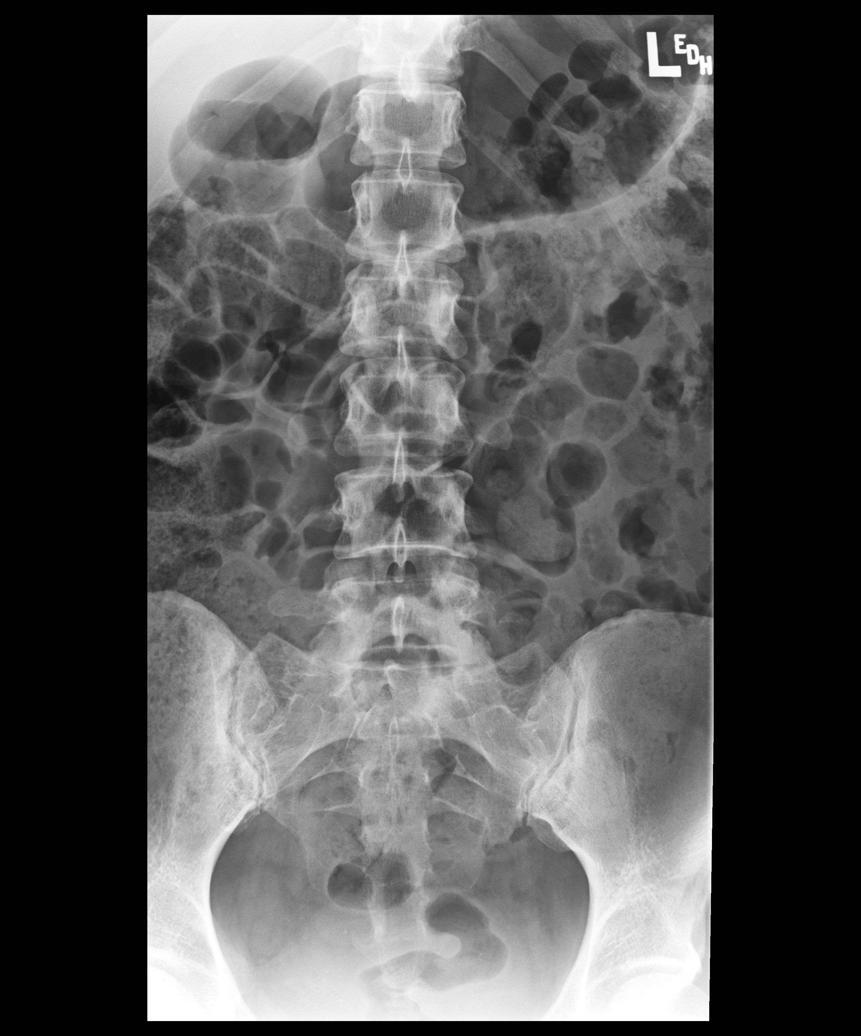

[dg lumbar spine complete 4 +v (2 of 5)]
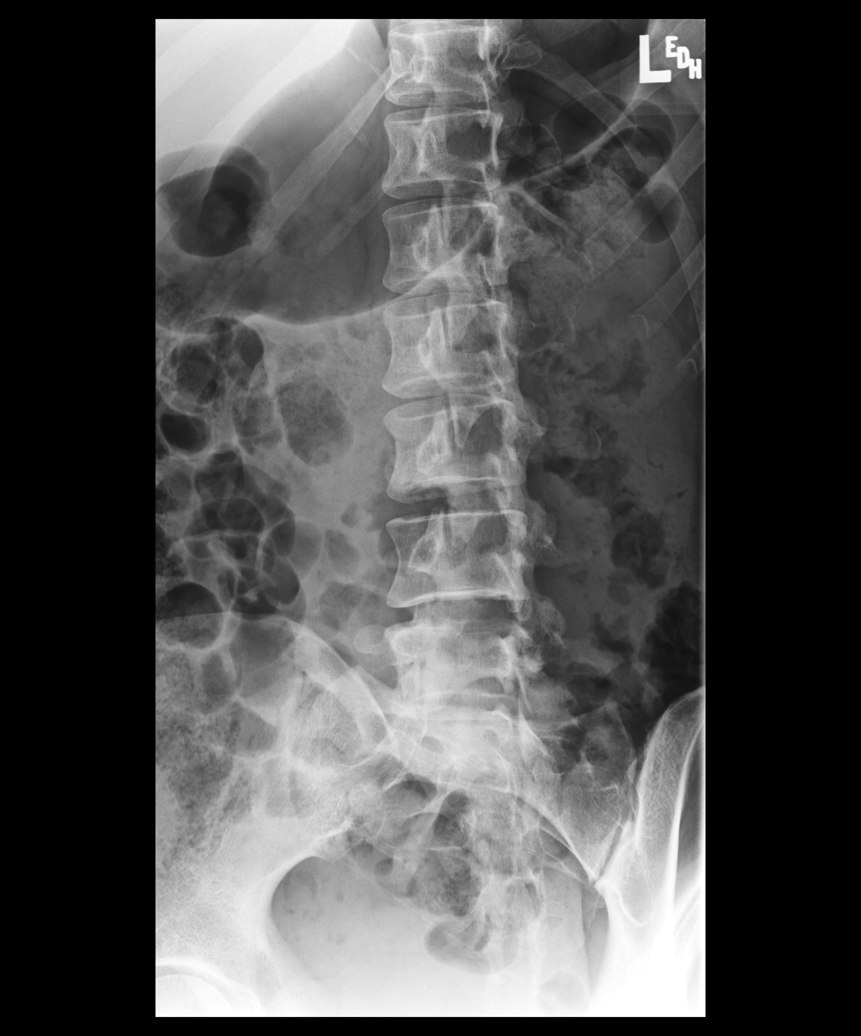

[dg lumbar spine complete 4 +v (3 of 5)]
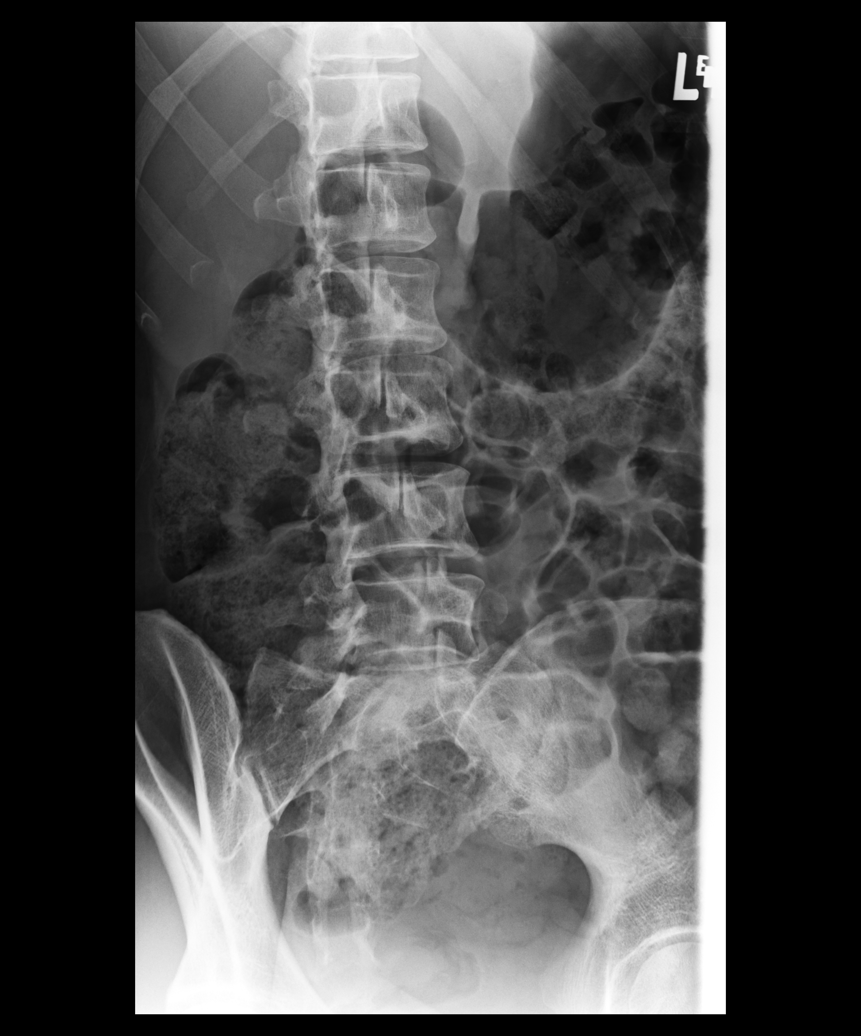

[dg lumbar spine complete 4 +v (4 of 5)]
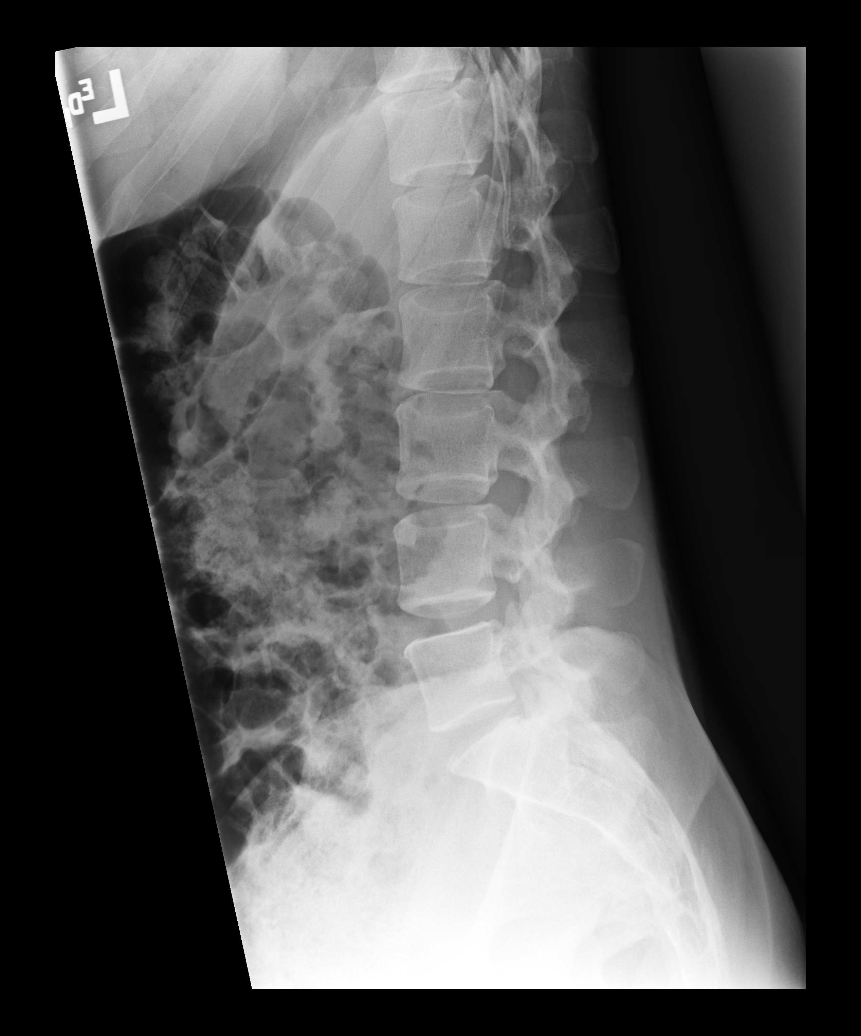

[dg lumbar spine complete 4 +v (5 of 5)]
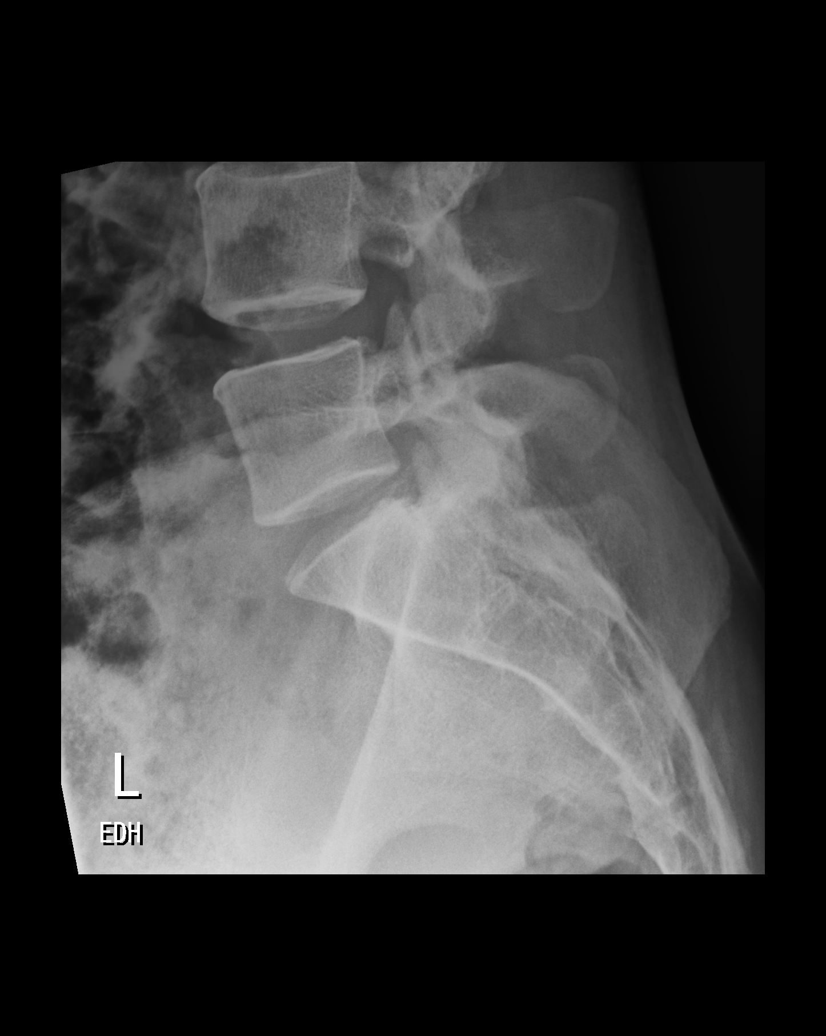

[5 of 5 positions shown; findings below may reference images not displayed]

FINDINGS: There is no evidence of lumbar spine fracture. Alignment is normal.
Intervertebral disc spaces are maintained.
IMPRESSION: Negative.

## 2018-06-23 LAB — OB RESULTS CONSOLE GBS: GBS: NEGATIVE

## 2018-06-25 ENCOUNTER — Encounter (HOSPITAL_COMMUNITY): Payer: Self-pay | Admitting: *Deleted

## 2018-06-25 NOTE — Patient Instructions (Signed)
GUILIANA SHOR  06/25/2018   Your procedure is scheduled on:  07/09/2018  Arrive at 9 at [employer] on [address] at [facility]  and [address]. You are invited to use the FREE valet parking or use the Visitor's parking deck.  Pick up the phone at the desk and dial [phone].  Call this number if you have problems the morning of surgery: [phone]  Remember:   Do not eat food:(After Midnight) Desps de medianoche.  Do not drink clear liquids: (After Midnight) Desps de medianoche.  Take these medicines the morning of surgery with A SIP OF WATER:  none   Do not wear jewelry, make-up or nail polish.  Do not wear lotions, powders, or perfumes. Do not wear deodorant.  Do not shave 48 hours prior to surgery.  Do not bring valuables to the hospital.  [facility] is not   responsible for any belongings or valuables brought to the hospital.  Contacts, dentures or bridgework may not be worn into surgery.  Leave suitcase in the car. After surgery it may be brought to your room.  For patients admitted to the hospital, checkout time is 11:00 AM the day of              discharge.      Please read over the following fact sheets that you were given:     Preparing for Surgery

## 2018-07-07 ENCOUNTER — Other Ambulatory Visit: Payer: Self-pay

## 2018-07-07 ENCOUNTER — Other Ambulatory Visit (HOSPITAL_COMMUNITY)
Admission: RE | Admit: 2018-07-07 | Discharge: 2018-07-07 | Disposition: A | Discharge status: Home / Self Care | Payer: BC Managed Care – PPO | Source: Ambulatory Visit | Attending: Obstetrics and Gynecology | Admitting: Obstetrics and Gynecology

## 2018-07-07 DIAGNOSIS — Z1159 Encounter for screening for other viral diseases: Secondary | ICD-10-CM | POA: Insufficient documentation

## 2018-07-07 LAB — SARS CORONAVIRUS 2 (TAT 6-24 HRS): SARS Coronavirus 2: NEGATIVE

## 2018-07-07 NOTE — MAU Note (Signed)
Swab collected without difficulty. 

## 2018-07-08 NOTE — H&P (Signed)
Alexis Carpenter is an 38 y.o.G37P1011 female presenting for scheduled repeat c/s at 70 2/[redacted]wks gestation. Pt conceived via ART. She has a history of seizure disorder that has been stable through pregnancy with no meds required. She is AMA. Pt had a prior cesarean section and opted for repeat rather than TOLAC. Her pregnancy was relatively uncomplicated. She had genetic bx on embryo - wnl. She is GBS negative.  OB History    Gravida  3   Para      Term      Preterm      AB  1   Living  1     SAB  1   TAB      Ectopic      Multiple      Live Births             Past Medical History:  Diagnosis Date  . Family history of breast cancer   . Family history of cancer of extrahepatic bile ducts   . Family history of non-Hodgkin's lymphoma    Past Surgical History:  Procedure Laterality Date  . CESAREAN SECTION    . DILATION AND EVACUATION N/A 11/02/2015   Procedure: DILATATION AND EVACUATION;  Surgeon: Sherlyn Hay, DO;  Location: [facility];  Service: Gynecology;  Laterality: N/A;  . WISDOM TOOTH EXTRACTION     Family History: family history includes Breast cancer in her maternal aunt; Breast cancer (age of onset: 22) in her maternal aunt; Cancer in her maternal aunt; Cervical cancer in her maternal aunt; Diabetes in her paternal grandfather; Heart attack in her maternal grandfather; Lung cancer in her maternal aunt; Lymphoma in her maternal uncle and paternal grandmother; Lymphoma (age of onset: 64) in her maternal aunt; Lymphoma (age of onset: 70) in her brother; Pneumonia in her paternal grandfather. Social History:  reports that she has quit smoking. She has never used smokeless tobacco. She reports current alcohol use of about 2.0 standard drinks of alcohol per week. She reports that she does not use drugs.     Maternal Diabetes: No Genetic Screening: Normal Maternal Ultrasounds/Referrals: Normal Fetal Ultrasounds or other Referrals:  None Maternal Substance Abuse:   No Significant Maternal Medications:  None Significant Maternal Lab Results:  Group B Strep negative Other Comments:  None  Review of Systems  Constitutional: Negative for chills, fever, malaise/fatigue and weight loss.  Eyes: Negative for blurred vision and double vision.  Respiratory: Negative for cough and shortness of breath.   Cardiovascular: Negative for chest pain.  Gastrointestinal: Negative for abdominal pain, heartburn, nausea and vomiting.  Genitourinary: Negative for dysuria.  Skin: Negative for itching and rash.  Neurological: Negative for dizziness and headaches.  Endo/Heme/Allergies: Does not bruise/bleed easily.  Psychiatric/Behavioral: Negative for depression, hallucinations, substance abuse and suicidal ideas. The patient is not nervous/anxious.    Maternal Medical History:  Reason for admission: Nausea. Repeat c/s  Fetal activity: Perceived fetal activity is normal.    Prenatal complications: no prenatal complications Prenatal Complications - Diabetes: none.      Last menstrual period 09/19/2017, unknown if currently breastfeeding. Maternal Exam:  Uterine Assessment: Contraction frequency is rare.   Abdomen: Fetal presentation: vertex  Introitus: Normal vulva. Vulva is negative for condylomata and lesion.  Normal vagina.  Vagina is negative for condylomata.  Cervix: Cervix evaluated by digital exam.     Physical Exam  Constitutional: She is oriented to person, place, and time. She appears well-developed and well-nourished.  Neck: Normal range of motion.  Cardiovascular: Normal rate.  Respiratory: Effort normal.  GI: Soft.  Genitourinary:    Vulva, vagina and uterus normal.     No vulval condylomata or lesion noted.   Musculoskeletal: Normal range of motion.  Neurological: She is alert and oriented to person, place, and time.  Skin: Skin is warm.  Psychiatric: She has a normal mood and affect. Her behavior is normal. Judgment and thought content  normal.    Prenatal labs: ABO, Rh: AB/Positive/-- (12/20 0000) Antibody:   Rubella: Immune (12/20 0000) RPR: Nonreactive (12/20 0000)  HBsAg: Negative (12/20 0000)  HIV: Non-reactive (12/20 0000)  GBS:     Assessment/Plan: 48yp G3P1011 presenting for repeat c/s at 75 2/[redacted]wks gestation NPO strict GBS neg Consent to procedure confirmed   [name] 07/08/2018, 6:53 AM

## 2018-07-09 ENCOUNTER — Encounter (HOSPITAL_COMMUNITY): Payer: Self-pay | Admitting: *Deleted

## 2018-07-09 ENCOUNTER — Encounter (HOSPITAL_COMMUNITY)
Admission: RE | Disposition: A | Discharge status: Home / Self Care | Payer: Self-pay | Source: Home / Self Care | Attending: Obstetrics and Gynecology

## 2018-07-09 ENCOUNTER — Inpatient Hospital Stay (HOSPITAL_COMMUNITY): Payer: BC Managed Care – PPO | Admitting: Anesthesiology

## 2018-07-09 ENCOUNTER — Other Ambulatory Visit: Payer: Self-pay

## 2018-07-09 ENCOUNTER — Inpatient Hospital Stay (HOSPITAL_COMMUNITY)
Admission: RE | Admit: 2018-07-09 | Discharge: 2018-07-12 | DRG: 787 | Disposition: A | Discharge status: Home / Self Care | Payer: BC Managed Care – PPO | Attending: Obstetrics and Gynecology | Admitting: Obstetrics and Gynecology

## 2018-07-09 DIAGNOSIS — Z3A39 39 weeks gestation of pregnancy: Secondary | ICD-10-CM

## 2018-07-09 DIAGNOSIS — Z87891 Personal history of nicotine dependence: Secondary | ICD-10-CM

## 2018-07-09 DIAGNOSIS — O34211 Maternal care for low transverse scar from previous cesarean delivery: Principal | ICD-10-CM | POA: Diagnosis present

## 2018-07-09 DIAGNOSIS — D62 Acute posthemorrhagic anemia: Secondary | ICD-10-CM | POA: Diagnosis not present

## 2018-07-09 DIAGNOSIS — Z3490 Encounter for supervision of normal pregnancy, unspecified, unspecified trimester: Secondary | ICD-10-CM

## 2018-07-09 DIAGNOSIS — Z98891 History of uterine scar from previous surgery: Secondary | ICD-10-CM

## 2018-07-09 DIAGNOSIS — O9081 Anemia of the puerperium: Secondary | ICD-10-CM | POA: Diagnosis not present

## 2018-07-09 HISTORY — DX: History of uterine scar from previous surgery: Z98.891

## 2018-07-09 LAB — CBC
HCT: 36.9 % (ref 36.0–46.0)
Hemoglobin: 12.4 g/dL (ref 12.0–15.0)
MCH: 30.5 pg (ref 26.0–34.0)
MCHC: 33.6 g/dL (ref 30.0–36.0)
MCV: 90.9 fL (ref 80.0–100.0)
Platelets: 210 10*3/uL (ref 150–400)
RBC: 4.06 MIL/uL (ref 3.87–5.11)
RDW: 12.5 % (ref 11.5–15.5)
WBC: 7.7 10*3/uL (ref 4.0–10.5)
nRBC: 0 % (ref 0.0–0.2)

## 2018-07-09 LAB — OB RESULTS CONSOLE RPR: RPR: NONREACTIVE

## 2018-07-09 LAB — OB RESULTS CONSOLE GC/CHLAMYDIA
Chlamydia: NEGATIVE
Gonorrhea: NEGATIVE

## 2018-07-09 LAB — ABO/RH: ABO/RH(D): AB POS

## 2018-07-09 LAB — OB RESULTS CONSOLE HIV ANTIBODY (ROUTINE TESTING): HIV: NONREACTIVE

## 2018-07-09 LAB — TYPE AND SCREEN
ABO/RH(D): AB POS
Antibody Screen: NEGATIVE

## 2018-07-09 LAB — OB RESULTS CONSOLE HEPATITIS B SURFACE ANTIGEN: Hepatitis B Surface Ag: NEGATIVE

## 2018-07-09 SURGERY — Surgical Case
Anesthesia: Spinal | Site: Abdomen | Wound class: Clean Contaminated

## 2018-07-09 MED ORDER — NALBUPHINE HCL 10 MG/ML IJ SOLN: 5.0000 mg | INTRAMUSCULAR | Status: DC | PRN

## 2018-07-09 MED ORDER — PHENYLEPHRINE HCL-NACL 20-0.9 MG/250ML-% IV SOLN
INTRAVENOUS | Status: DC | PRN
  Administered 2018-07-09: 60 ug/min via INTRAVENOUS

## 2018-07-09 MED ORDER — FENTANYL CITRATE (PF) 100 MCG/2ML IJ SOLN: 25.0000 ug | INTRAMUSCULAR | Status: DC | PRN

## 2018-07-09 MED ORDER — KETOROLAC TROMETHAMINE 30 MG/ML IJ SOLN
INTRAMUSCULAR | Status: AC
  Filled 2018-07-09: qty 1

## 2018-07-09 MED ORDER — LACTATED RINGERS IV SOLN
INTRAVENOUS | Status: DC
  Administered 2018-07-09 (×2): via INTRAVENOUS

## 2018-07-09 MED ORDER — DEXTROSE 5 % IV SOLN
INTRAVENOUS | Status: AC
  Filled 2018-07-09: qty 3000

## 2018-07-09 MED ORDER — DIPHENHYDRAMINE HCL 25 MG PO CAPS: 25.0000 mg | ORAL_CAPSULE | Freq: Four times a day (QID) | ORAL | Status: DC | PRN

## 2018-07-09 MED ORDER — KETOROLAC TROMETHAMINE 30 MG/ML IJ SOLN: 30.0000 mg | Freq: Four times a day (QID) | INTRAMUSCULAR | Status: DC | PRN

## 2018-07-09 MED ORDER — PRENATAL MULTIVITAMIN CH
1.0000 | ORAL_TABLET | Freq: Every day | ORAL | Status: DC
  Administered 2018-07-10 – 2018-07-12 (×3): 1 via ORAL
  Filled 2018-07-09 (×3): qty 1

## 2018-07-09 MED ORDER — ONDANSETRON HCL 4 MG/2ML IJ SOLN
INTRAMUSCULAR | Status: DC | PRN
  Administered 2018-07-09: 4 mg via INTRAVENOUS

## 2018-07-09 MED ORDER — ACETAMINOPHEN 500 MG PO TABS
1000.0000 mg | ORAL_TABLET | ORAL | Status: AC
  Administered 2018-07-09: 1000 mg via ORAL

## 2018-07-09 MED ORDER — BUPIVACAINE IN DEXTROSE 0.75-8.25 % IT SOLN
INTRATHECAL | Status: DC | PRN
  Administered 2018-07-09: 1.6 mL via INTRATHECAL

## 2018-07-09 MED ORDER — COCONUT OIL OIL
1.0000 "application " | TOPICAL_OIL | Status: DC | PRN
  Administered 2018-07-09: 1 via TOPICAL

## 2018-07-09 MED ORDER — NALOXONE HCL 0.4 MG/ML IJ SOLN: 0.4000 mg | INTRAMUSCULAR | Status: DC | PRN

## 2018-07-09 MED ORDER — OXYTOCIN 40 UNITS IN NORMAL SALINE INFUSION - SIMPLE MED: 2.5000 [IU]/h | INTRAVENOUS | Status: DC

## 2018-07-09 MED ORDER — NALBUPHINE HCL 10 MG/ML IJ SOLN: 5.0000 mg | Freq: Once | INTRAMUSCULAR | Status: DC | PRN

## 2018-07-09 MED ORDER — ACETAMINOPHEN 500 MG PO TABS
ORAL_TABLET | ORAL | Status: AC
  Filled 2018-07-09: qty 2

## 2018-07-09 MED ORDER — OXYTOCIN 40 UNITS IN NORMAL SALINE INFUSION - SIMPLE MED
INTRAVENOUS | Status: DC | PRN
  Administered 2018-07-09 (×3): 100 mL via INTRAVENOUS

## 2018-07-09 MED ORDER — SENNOSIDES-DOCUSATE SODIUM 8.6-50 MG PO TABS
2.0000 | ORAL_TABLET | ORAL | Status: DC
  Administered 2018-07-09 – 2018-07-11 (×3): 2 via ORAL
  Filled 2018-07-09 (×3): qty 2

## 2018-07-09 MED ORDER — MENTHOL 3 MG MT LOZG: 1.0000 | LOZENGE | OROMUCOSAL | Status: DC | PRN

## 2018-07-09 MED ORDER — ONDANSETRON HCL 4 MG/2ML IJ SOLN
INTRAMUSCULAR | Status: AC
  Filled 2018-07-09: qty 2

## 2018-07-09 MED ORDER — OXYCODONE-ACETAMINOPHEN 5-325 MG PO TABS
1.0000 | ORAL_TABLET | ORAL | Status: DC | PRN
  Filled 2018-07-09: qty 1

## 2018-07-09 MED ORDER — PHENYLEPHRINE HCL-NACL 20-0.9 MG/250ML-% IV SOLN
INTRAVENOUS | Status: AC
  Filled 2018-07-09: qty 250

## 2018-07-09 MED ORDER — IBUPROFEN 800 MG PO TABS
800.0000 mg | ORAL_TABLET | Freq: Three times a day (TID) | ORAL | Status: AC
  Administered 2018-07-09 – 2018-07-12 (×9): 800 mg via ORAL
  Filled 2018-07-09 (×7): qty 1
  Filled 2018-07-09: qty 4
  Filled 2018-07-09: qty 1

## 2018-07-09 MED ORDER — DIBUCAINE (PERIANAL) 1 % EX OINT: 1.0000 "application " | TOPICAL_OINTMENT | CUTANEOUS | Status: DC | PRN

## 2018-07-09 MED ORDER — DIPHENHYDRAMINE HCL 50 MG/ML IJ SOLN: 12.5000 mg | INTRAMUSCULAR | Status: DC | PRN

## 2018-07-09 MED ORDER — CEFAZOLIN SODIUM-DEXTROSE 2-3 GM-%(50ML) IV SOLR
INTRAVENOUS | Status: DC | PRN
  Administered 2018-07-09: 2 g via INTRAVENOUS

## 2018-07-09 MED ORDER — SIMETHICONE 80 MG PO CHEW
80.0000 mg | CHEWABLE_TABLET | ORAL | Status: DC
  Administered 2018-07-09 – 2018-07-11 (×3): 80 mg via ORAL
  Filled 2018-07-09 (×3): qty 1

## 2018-07-09 MED ORDER — ACETAMINOPHEN 500 MG PO TABS
ORAL_TABLET | ORAL | Status: AC
  Filled 2018-07-09: qty 1

## 2018-07-09 MED ORDER — SCOPOLAMINE 1 MG/3DAYS TD PT72
1.0000 | MEDICATED_PATCH | Freq: Once | TRANSDERMAL | Status: AC
  Administered 2018-07-09: 09:00:00 1.5 mg via TRANSDERMAL

## 2018-07-09 MED ORDER — ONDANSETRON HCL 4 MG/2ML IJ SOLN: 4.0000 mg | Freq: Three times a day (TID) | INTRAMUSCULAR | Status: DC | PRN

## 2018-07-09 MED ORDER — KETOROLAC TROMETHAMINE 30 MG/ML IJ SOLN: 30.0000 mg | Freq: Once | INTRAMUSCULAR | Status: DC

## 2018-07-09 MED ORDER — TETANUS-DIPHTH-ACELL PERTUSSIS 5-2.5-18.5 LF-MCG/0.5 IM SUSP: 0.5000 mL | Freq: Once | INTRAMUSCULAR | Status: DC

## 2018-07-09 MED ORDER — SIMETHICONE 80 MG PO CHEW
80.0000 mg | CHEWABLE_TABLET | Freq: Three times a day (TID) | ORAL | Status: DC
  Administered 2018-07-09 – 2018-07-12 (×9): 80 mg via ORAL
  Filled 2018-07-09 (×8): qty 1

## 2018-07-09 MED ORDER — ACETAMINOPHEN 500 MG PO TABS
1000.0000 mg | ORAL_TABLET | Freq: Four times a day (QID) | ORAL | Status: DC
  Administered 2018-07-09 (×3): 1000 mg via ORAL
  Filled 2018-07-09 (×3): qty 2

## 2018-07-09 MED ORDER — ZOLPIDEM TARTRATE 5 MG PO TABS: 5.0000 mg | ORAL_TABLET | Freq: Every evening | ORAL | Status: DC | PRN

## 2018-07-09 MED ORDER — SIMETHICONE 80 MG PO CHEW: 80.0000 mg | CHEWABLE_TABLET | ORAL | Status: DC | PRN

## 2018-07-09 MED ORDER — LACTATED RINGERS IV SOLN
INTRAVENOUS | Status: DC
  Administered 2018-07-09 (×2): via INTRAVENOUS

## 2018-07-09 MED ORDER — SCOPOLAMINE 1 MG/3DAYS TD PT72
MEDICATED_PATCH | TRANSDERMAL | Status: AC
  Filled 2018-07-09: qty 1

## 2018-07-09 MED ORDER — MEPERIDINE HCL 25 MG/ML IJ SOLN: 6.2500 mg | INTRAMUSCULAR | Status: DC | PRN

## 2018-07-09 MED ORDER — FENTANYL CITRATE (PF) 100 MCG/2ML IJ SOLN
INTRAMUSCULAR | Status: DC | PRN
  Administered 2018-07-09: 15 ug via INTRATHECAL

## 2018-07-09 MED ORDER — OXYTOCIN 40 UNITS IN NORMAL SALINE INFUSION - SIMPLE MED
INTRAVENOUS | Status: AC
  Filled 2018-07-09: qty 1000

## 2018-07-09 MED ORDER — DIPHENHYDRAMINE HCL 25 MG PO CAPS: 25.0000 mg | ORAL_CAPSULE | ORAL | Status: DC | PRN

## 2018-07-09 MED ORDER — MORPHINE SULFATE (PF) 0.5 MG/ML IJ SOLN
INTRAMUSCULAR | Status: AC
  Filled 2018-07-09: qty 10

## 2018-07-09 MED ORDER — FENTANYL CITRATE (PF) 100 MCG/2ML IJ SOLN
INTRAMUSCULAR | Status: AC
  Filled 2018-07-09: qty 2

## 2018-07-09 MED ORDER — MORPHINE SULFATE (PF) 0.5 MG/ML IJ SOLN
INTRAMUSCULAR | Status: DC | PRN
  Administered 2018-07-09: .15 mg via INTRATHECAL

## 2018-07-09 MED ORDER — SODIUM CHLORIDE 0.9% FLUSH: 3.0000 mL | INTRAVENOUS | Status: DC | PRN

## 2018-07-09 MED ORDER — NALOXONE HCL 4 MG/10ML IJ SOLN
1.0000 ug/kg/h | INTRAVENOUS | Status: DC | PRN
  Filled 2018-07-09: qty 5

## 2018-07-09 MED ORDER — CEFAZOLIN SODIUM-DEXTROSE 2-4 GM/100ML-% IV SOLN: 2.0000 g | INTRAVENOUS | Status: DC

## 2018-07-09 MED ORDER — WITCH HAZEL-GLYCERIN EX PADS: 1.0000 "application " | MEDICATED_PAD | CUTANEOUS | Status: DC | PRN

## 2018-07-09 MED ORDER — DEXAMETHASONE SODIUM PHOSPHATE 4 MG/ML IJ SOLN
INTRAMUSCULAR | Status: AC
  Filled 2018-07-09: qty 1

## 2018-07-09 SURGICAL SUPPLY — 36 items
BENZOIN TINCTURE PRP APPL 2/3 (GAUZE/BANDAGES/DRESSINGS) ×1 IMPLANT
CHLORAPREP W/TINT 26ML (MISCELLANEOUS) ×2 IMPLANT
CLAMP CORD UMBIL (MISCELLANEOUS) IMPLANT
CLOTH BEACON ORANGE TIMEOUT ST (SAFETY) ×2 IMPLANT
DRAPE C SECTION CLR SCREEN (DRAPES) ×2 IMPLANT
DRSG OPSITE POSTOP 4X10 (GAUZE/BANDAGES/DRESSINGS) ×2 IMPLANT
ELECT REM PT RETURN 9FT ADLT (ELECTROSURGICAL) ×2
ELECTRODE REM PT RTRN 9FT ADLT (ELECTROSURGICAL) ×1 IMPLANT
EXTRACTOR VACUUM KIWI (MISCELLANEOUS) IMPLANT
GLOVE BIO SURGEON STRL SZ 6.5 (GLOVE) ×2 IMPLANT
GLOVE BIOGEL PI IND STRL 7.0 (GLOVE) ×2 IMPLANT
GLOVE BIOGEL PI INDICATOR 7.0 (GLOVE) ×2
GOWN STRL REUS W/TWL LRG LVL3 (GOWN DISPOSABLE) ×4 IMPLANT
KIT ABG SYR 3ML LUER SLIP (SYRINGE) IMPLANT
NDL HYPO 25X5/8 SAFETYGLIDE (NEEDLE) IMPLANT
NEEDLE HYPO 25X5/8 SAFETYGLIDE (NEEDLE) IMPLANT
NS IRRIG 1000ML POUR BTL (IV SOLUTION) ×2 IMPLANT
PACK C SECTION WH (CUSTOM PROCEDURE TRAY) ×2 IMPLANT
PAD OB MATERNITY 4.3X12.25 (PERSONAL CARE ITEMS) ×2 IMPLANT
RETRACTOR WND ALEXIS 25 LRG (MISCELLANEOUS) ×1 IMPLANT
RTRCTR C-SECT PINK 25CM LRG (MISCELLANEOUS) IMPLANT
RTRCTR WOUND ALEXIS 25CM LRG (MISCELLANEOUS) ×2
STRIP CLOSURE SKIN 1/2X4 (GAUZE/BANDAGES/DRESSINGS) ×1 IMPLANT
SUT CHROMIC 1 CTX 36 (SUTURE) ×4 IMPLANT
SUT PLAIN 0 NONE (SUTURE) IMPLANT
SUT PLAIN 2 0 XLH (SUTURE) ×2 IMPLANT
SUT VIC AB 0 CT1 27 (SUTURE) ×2
SUT VIC AB 0 CT1 27XBRD ANBCTR (SUTURE) ×2 IMPLANT
SUT VIC AB 2-0 CT1 27 (SUTURE) ×1
SUT VIC AB 2-0 CT1 TAPERPNT 27 (SUTURE) ×1 IMPLANT
SUT VIC AB 3-0 CT1 27 (SUTURE)
SUT VIC AB 3-0 CT1 TAPERPNT 27 (SUTURE) IMPLANT
SUT VIC AB 4-0 KS 27 (SUTURE) ×2 IMPLANT
TOWEL OR 17X24 6PK STRL BLUE (TOWEL DISPOSABLE) ×2 IMPLANT
TRAY FOLEY W/BAG SLVR 14FR LF (SET/KITS/TRAYS/PACK) ×2 IMPLANT
WATER STERILE IRR 1000ML POUR (IV SOLUTION) ×2 IMPLANT

## 2018-07-09 NOTE — Anesthesia Procedure Notes (Signed)
Spinal  Patient location during procedure: OR Start time: 07/09/2018 7:28 AM End time: 07/09/2018 7:33 AM Staffing Anesthesiologist: Suzette Battiest, MD Performed: anesthesiologist  Preanesthetic Checklist Completed: patient identified, site marked, surgical consent, pre-op evaluation, timeout performed, IV checked, risks and benefits discussed and monitors and equipment checked Spinal Block Patient position: sitting Prep: DuraPrep Patient monitoring: heart rate, cardiac monitor, continuous pulse ox and blood pressure Approach: midline Location: L3-4 Injection technique: single-shot Needle Needle type: Pencan  Needle gauge: 24 G Needle length: 9 cm Assessment Sensory level: T4

## 2018-07-09 NOTE — Transfer of Care (Signed)
Immediate Anesthesia Transfer of Care Note  Patient: Alexis Carpenter  Procedure(s) Performed: CESAREAN SECTION (N/A Abdomen)  Patient Location: PACU  Anesthesia Type:Spinal  Level of Consciousness: awake, alert  and oriented  Airway & Oxygen Therapy: Patient Spontanous Breathing and Patient connected to nasal cannula oxygen  Post-op Assessment: Report given to RN and Post -op Vital signs reviewed and stable  Post vital signs: Reviewed and stable  Last Vitals:  Vitals Value Taken Time  BP    Temp    Pulse    Resp    SpO2      Last Pain:  Vitals:   07/09/18 0513  TempSrc: Oral  PainSc: 0-No pain         Complications: No apparent anesthesia complications

## 2018-07-09 NOTE — Interval H&P Note (Signed)
History and Physical Interval Note: No change from H/P Procedure and expectations reviewed and consent obtained To OR when ready  07/09/2018 7:25 AM  Alexis Carpenter  has presented today for surgery, with the diagnosis of repeat c-section.  The various methods of treatment have been discussed with the patient and family. After consideration of risks, benefits and other options for treatment, the patient has consented to  Procedure(s) with comments: CESAREAN SECTION (N/A) - Heather, RNFA as a surgical intervention.  The patient's history has been reviewed, patient examined, no change in status, stable for surgery.  I have reviewed the patient's chart and labs.  Questions were answered to the patient's satisfaction.     [name]

## 2018-07-09 NOTE — Anesthesia Preprocedure Evaluation (Signed)
Anesthesia Evaluation  Patient identified by MRN, date of birth, ID band Patient awake    Reviewed: Allergy & Precautions, NPO status , Patient's Chart, lab work & pertinent test results  Airway Mallampati: II  TM Distance: >3 FB     Dental   Pulmonary former smoker,    breath sounds clear to auscultation       Cardiovascular negative cardio ROS   Rhythm:Regular Rate:Normal     Neuro/Psych negative neurological ROS     GI/Hepatic negative GI ROS, Neg liver ROS,   Endo/Other  negative endocrine ROS  Renal/GU negative Renal ROS     Musculoskeletal   Abdominal   Peds  Hematology negative hematology ROS (+)   Anesthesia Other Findings   Reproductive/Obstetrics (+) Pregnancy                             Lab Results  Component Value Date   WBC 7.7 07/09/2018   HGB 12.4 07/09/2018   HCT 36.9 07/09/2018   MCV 90.9 07/09/2018   PLT 210 07/09/2018   Lab Results  Component Value Date   CREATININE 0.54 (L) 12/19/2017   BUN 6 12/19/2017   NA 136 12/19/2017   K 4.5 12/19/2017   CL 101 12/19/2017   CO2 23 12/19/2017    Anesthesia Physical Anesthesia Plan  ASA: II  Anesthesia Plan: Spinal   Post-op Pain Management:    Induction:   PONV Risk Score and Plan: 2 and Dexamethasone, Ondansetron and Treatment may vary due to age or medical condition  Airway Management Planned: Natural Airway  Additional Equipment:   Intra-op Plan:   Post-operative Plan:   Informed Consent: I have reviewed the patients History and Physical, chart, labs and discussed the procedure including the risks, benefits and alternatives for the proposed anesthesia with the patient or authorized representative who has indicated his/her understanding and acceptance.       Plan Discussed with: CRNA and Surgeon  Anesthesia Plan Comments:         Anesthesia Quick Evaluation

## 2018-07-09 NOTE — Op Note (Signed)
Operative Note    Preoperative Diagnosis: IUP at 29 0/7wks                                            Prior low transverse cesarean section   Postoperative Diagnosis: Same   Procedure: Repeat low transverse cesarean section with double layered closure   Surgeon: Mickle Mallory DO Assist: Claretta Fraise RNFA  Anesthesia: Spinal  Fluids: LR 1313ml EBL: 432ml UOP: 130ml   Findings: Viable female infant in vertex position, Grossly normal uterus, tubes and ovaries, Apgars 8,9, weight pending   Specimen: placenta toL/D   Procedure Note Consent verified pre-op. All questions answered   Patient was taken to the operating room where spinal anesthesia was administered. She was prepped and draped in the normal sterile fashion and placed in the dorsal supine position with a leftward tilt. An appropriate time out was performed. A Pfannenstiel skin incision was then made through the previous incision with the scalpel and carried through to the underlying layer of fascia by sharp dissection and Bovie cautery. The fascia was nicked in the midline and the incision was extended laterally with Mayo scissors. The superior and inferior aspects of the incision were grasped with Coker clamps and dissected off the underlying rectus muscles.Rectus muscles were separated in the midline  and the peritoneal cavity entered bluntly. The peritoneal incision was then extended both superiorly and inferiorly with careful attention to avoid both bowel and bladder. The Alexis self-retaining wound retractor was then placed and the lower uterine segment exposed. The bladder flap was developed with Metzenbaum scissors and pushed away from the lower uterine segment. The lower uterine segment was then incised in a transverse fashion and the cavity itself entered bluntly. . The infant's head was then lifted and delivered from the incision without difficulty. Vigorous spontaneous cry was noted during delivery.  The remainder of  the infant delivered and the nose and mouth bulb suctioned. The cord was clamped and cut after a minute delay. The infant was handed off to the waiting pediatricians. The placenta was then spontaneously expressed from the uterus and the uterus cleared of all clots and debris with moist lap sponge. The uterine incision was then repaired in 2 layers the first layer was a running locked layer 1-0 chromic and the second an imbricating layer of the same suture. The tubes and ovaries were inspected and the gutters cleared of all clots and debris. The uterine incision was inspected and found to be hemostatic. All instruments and sponges were then removed from the abdomen. The peritoneum was then reapproximated in a running fashion with the rectus incorporated using sutures of 2-0 Vicryl. . The fascia was then closed with 0 Vicryl in a running fashion. Subcutaneous tissue was reapproximated with 3-0 plain. The skin was closed with a subcuticular stitch of 4-0 Vicryl on a Keith needle and then reinforced with benzoin and Steri-Strips. At the conclusion of the procedure all instruments and sponge counts were correct. Patient was taken to the recovery room in good condition with her baby accompanying her skin to skin.

## 2018-07-09 NOTE — Lactation Note (Signed)
This note was copied from a baby's chart. Lactation Consultation Note  Patient Name: Alexis Carpenter [id number] Date: 07/09/2018 Reason for consult: Initial assessment  1948 - 2012 - I visited Alexis Carpenter to assist with breast feeding. She was holding her baby upon entry. She states that her daughter has been latching briefly throughout the day. Baby was cueing, and I observed mom latch her. She placed her in cradle hold on the right breast and tried to point baby to the breast, nose to nipple. Baby seemed to struggle to find and grasp her nipple.   I offered to assist, and mom agreed. I showed her how to compress the breast and how to support baby's neck to latch her. Baby latched briefly but would quickly release the breast. She would then root and try to latch again.   Mom's nipples are evert and fibrous/thick. I did not conduct a thorough oral exam of baby, but a quick observation did not reveal an obvious tongue restriction.  I offered to try using a nipple shield. I placed a size 24 shield on the breast. Baby grasped the shield but then compressed the nipple without suckling. I removed baby and the shield from the breast, and I suggested that mom keep it just in case it's needed tonight.  Baby seems uncoordinated and disorganized. We discussed normal infant feeding patterns for day 1. I showed mom how to hand express. She is very sensitive. I noted a glisten of colostrum. I encouraged Alexis Carpenter to do lots of skin to skin and to encourage lick and learn tonight. She is to call her RN for assistance as needed tonight.  I shared our community breast feeding resources and suggested that we could follow up tomorrow. Alexis Carpenter agreed. She had no additional questions at this time. Baby was still exhibiting hunger cues upon exit. I followed up with evening RN to discuss plan and possible overnight interventions.  Maternal Data Formula Feeding for Exclusion: No Has patient been taught Hand  Expression?: Yes Does the patient have breastfeeding experience prior to this delivery?: Yes  Feeding Feeding Type: Breast Fed  LATCH Score Latch: Repeated attempts needed to sustain latch, nipple held in mouth throughout feeding, stimulation needed to elicit sucking reflex.  Audible Swallowing: None  Type of Nipple: Everted at rest and after stimulation  Comfort (Breast/Nipple): Soft / non-tender  Hold (Positioning): Assistance needed to correctly position infant at breast and maintain latch.  LATCH Score: 6  Interventions Interventions: Breast feeding basics reviewed;Assisted with latch;Hand express;Breast compression;Adjust position;Support pillows;Position options  Lactation Tools Discussed/Used Tools: Nipple Shields Nipple shield size: 24   Consult Status Consult Status: Follow-up Date: 07/10/18 Follow-up type: In-patient    Lenore Manner 07/09/2018, 9:32 PM

## 2018-07-10 LAB — CBC
HCT: 25 % — ABNORMAL LOW (ref 36.0–46.0)
Hemoglobin: 8.3 g/dL — ABNORMAL LOW (ref 12.0–15.0)
MCH: 30.6 pg (ref 26.0–34.0)
MCHC: 33.2 g/dL (ref 30.0–36.0)
MCV: 92.3 fL (ref 80.0–100.0)
Platelets: 167 10*3/uL (ref 150–400)
RBC: 2.71 MIL/uL — ABNORMAL LOW (ref 3.87–5.11)
RDW: 12.9 % (ref 11.5–15.5)
WBC: 7.7 10*3/uL (ref 4.0–10.5)
nRBC: 0 % (ref 0.0–0.2)

## 2018-07-10 LAB — BIRTH TISSUE RECOVERY COLLECTION (PLACENTA DONATION)

## 2018-07-10 LAB — RPR: RPR Ser Ql: NONREACTIVE

## 2018-07-10 MED ORDER — ACETAMINOPHEN 500 MG PO TABS
1000.0000 mg | ORAL_TABLET | Freq: Four times a day (QID) | ORAL | Status: DC | PRN
  Administered 2018-07-10 (×3): 1000 mg via ORAL
  Filled 2018-07-10 (×3): qty 2

## 2018-07-10 MED ORDER — OXYCODONE HCL 5 MG PO TABS
5.0000 mg | ORAL_TABLET | ORAL | Status: DC | PRN
  Administered 2018-07-10 (×3): 5 mg via ORAL
  Filled 2018-07-10 (×3): qty 1

## 2018-07-10 NOTE — Progress Notes (Signed)
Notified Dr. Marvel Plan regarding patient pain. Patient received Ibuprofen and Tylenol at midnight. Tylenol exceeded 4g in 24 hours so ordered Percocet PRN was not given at 0151. Dr. Marvel Plan gave orders for Oxy IR 5 mg PRN to be given. Will continue to monitor.

## 2018-07-10 NOTE — Progress Notes (Signed)
Subjective: Postpartum Day #1: Cesarean Delivery Patient reports incisional pain, tolerating PO and no problems voiding.    Objective: Vital signs in last 24 hours: Temp:  [97.6 F (36.4 C)-98.5 F (36.9 C)] 98.3 F (36.8 C) (07/03 0503) Pulse Rate:  [62-88] 67 (07/03 0503) Resp:  [16-22] 18 (07/03 0503) BP: (86-110)/(54-76) 94/61 (07/03 0503) SpO2:  [98 %-100 %] 98 % (07/03 0503)  Physical Exam:  General: alert Lochia: appropriate Uterine Fundus: firm Incision: dressing C/D/I   Recent Labs    07/09/18 0506 07/10/18 0421  HGB 12.4 8.3*  HCT 36.9 25.0*    Assessment/Plan: Status post Cesarean section. Doing well postoperatively. Has acute blood loss anemia, asymptomatic so far Continue current care, ambulate, hydrate.  Alexis Carpenter 07/10/2018, 8:52 AM

## 2018-07-11 NOTE — Lactation Note (Signed)
This note was copied from a baby's chart. Lactation Consultation Note  Patient Name: Girl Chelsy Parrales [id number] Date: 07/11/2018   Mom was assisted with latching infant. The teacup hold was not successful in getting infant to latch, but a latch was briefly obtained. A nipple shield was needed to get infant to maintain latch, but the lack of flow from the breast was preventing "Zoe" from being engaged. I gave the choice of supplementing at the breast or supplementing with a bottle. Parents chose bottle.  Parents had been using the Enfamil Slow-Flow nipple to feed "Zoe" and had said that she could consume 20 mL in 5 min. I offered to see how she would do on the Similac slow-flow nipple (a slower nipple). With the Similac nipple, the swallows were fast & loud. I took her to an Enfamil Extra Slow-Flow nipple where Zoe did much better, drinking comfortably with soft swallows (she drank 22 mL in 10 minutes). I provided enough Enfamil Extra Slow-Flow nipples for parents to feed over the next 24 hours (until discharge).   I encouraged Mom to express/pump her milk every time infant receives formula. Mom was noted to have excellent veining on her breasts.    Matthias Hughs [facility] 07/11/2018, 3:03 PM

## 2018-07-11 NOTE — Lactation Note (Signed)
This note was copied from a baby's chart. Lactation Consultation Note  Patient Name: Alexis Carpenter [id number] Date: 07/11/2018 Reason for consult: Difficult latch P2, 66 hour female infant.  Mom feeding plan at admission was breastfeeding and supplementing with formula. Per mom, infant is not sustaining latch, she is supplementing with formula. Per mom, she had given infant formula prior to [facility] entering the room. LC reviewed hand expression and mom has colostrum present in breast.  LC discussed with mom to wear breast shells during the day to help evert nipple shaft out and mom will use harmony hand pump and pre-pump prior to latching infant to breast. LC notice mom has flat nipples and is short shafted. Per mom, she started using DEBP today and used it 3 times. Mom used hand pump and expressed 5 ml of colostrum which she will offer at next feeding  after breastfeeding. Mom's plan: 1. Breastfeed according hunger cues, 8 to 12 times within 24 hours and breastfeed on demand. 2. Mom will pre-pump prior to latching infant to breast. 3. Mom will wear breast shells in bra during the day to help evert nipple shaft out more. 4. Mom plans to pump every 3 hours for 15 minutes and give infant back EBM/ before supplementing with formula based on infant's age/ hours of life. 72. Mom will ask for help with latch assistance if needed by Nurse or LC.   Maternal Data    Feeding    LATCH Score                   Interventions Interventions: Pre-pump if needed;Shells;Hand pump;DEBP  Lactation Tools Discussed/Used Tools: Shells;Pump Shell Type: Other (comment)(flat and short shafted) Breast pump type: Manual   Consult Status Consult Status: Follow-up Date: 07/11/18 Follow-up type: In-patient    Alexis Carpenter 07/11/2018, 2:30 AM

## 2018-07-11 NOTE — Lactation Note (Signed)
This note was copied from a baby's chart. Lactation Consultation Note  Patient Name: Alexis Carpenter [id number] Date: 07/11/2018   Infant is 5 hrs old. Mom reports an adequate supply with her 1st child, but it took 5 days - 1 week for her milk to come to volume. Mom reports that she also took fenugreek with the 1st child and felt that helped. Parents report that they were in the hospital for 4 days with their 1st child b/c of weight loss. This infant is currently at almost 9% weight loss.   Mom has been wearing shells since this morning. Infant recently drank 9 mL and is sleeping contentedly.   Parents will call me when infant is ready for next feeding. We will attempt to use the "teacup hold" to get infant to latch.   Matthias Hughs [facility] 07/11/2018, 11:28 AM

## 2018-07-11 NOTE — Progress Notes (Signed)
POD #2 Sore but better, working on latching Afeb, VSS Abd- soft, fundus firm, incision intact Continue routine care

## 2018-07-12 MED ORDER — OXYCODONE HCL 5 MG PO TABS: 5.0000 mg | ORAL_TABLET | ORAL | 0 refills | Status: DC | PRN

## 2018-07-12 MED ORDER — IBUPROFEN 600 MG PO TABS
600.0000 mg | ORAL_TABLET | Freq: Four times a day (QID) | ORAL | Status: DC | PRN
  Administered 2018-07-12: 12:00:00 600 mg via ORAL
  Filled 2018-07-12: qty 1

## 2018-07-12 MED ORDER — IBUPROFEN 600 MG PO TABS: 600.0000 mg | ORAL_TABLET | Freq: Four times a day (QID) | ORAL | 0 refills | Status: DC | PRN

## 2018-07-12 NOTE — Lactation Note (Signed)
This note was copied from a baby's chart. Lactation Consultation Note  Patient Name: Alexis Carpenter [id number] Date: 07/12/2018 Reason for consult: Follow-up assessment;Term;Infant weight loss;Difficult latch  Visited with P2 Mom of term baby on day of discharge.  Baby is 52 hrs old, and weight loss stable at 8.6%.    Due to difficult latch, Mom is choosing to offer EBM+/formula by bottle, and double pump to support her milk supply.  Plan- 1- Keep baby STS as much as possible 2- Feed baby with any cue, offer breast prn (using nipple shield) 3- Pump both breasts 15-20 mins, using breast massage and hand expression also to support a full milk supply 4- Call for assistance prn 5- follow-up with OP lactation (request sent to Clinic)  Need Referral from Pediatrician for OP lactation consultation.   Interventions Interventions: Breast feeding basics reviewed;Skin to skin;Breast massage;Hand express;DEBP  Lactation Tools Discussed/Used Tools: Pump;Shells Shell Type: Inverted Breast pump type: Double-Electric Breast Pump   Consult Status Consult Status: Complete Date: 07/12/18 Follow-up type: Out-patient    Broadus John 07/12/2018, 9:25 AM

## 2018-07-12 NOTE — Discharge Instructions (Signed)
As per discharge pamphlet °

## 2018-07-12 NOTE — Progress Notes (Signed)
POD #3 LTCS Sore but better Afeb, VSS Abd- soft, fundus firm, incision intact D/c home

## 2018-07-12 NOTE — Discharge Summary (Signed)
OB Discharge Summary     Patient Name: Alexis Carpenter DOB: 09-16-1980 MRN: [id number]  Date of admission: 07/09/2018 Delivering MD: Carlynn Purl [facility]   Date of discharge: 07/12/2018  Admitting diagnosis: repeat c-section Intrauterine pregnancy: Unknown     Secondary diagnosis:  Active Problems:   Term pregnancy   Status post repeat low transverse cesarean section   Postpartum care following cesarean delivery      Discharge diagnosis: Term Pregnancy [facility] course:  Sceduled C/S   38 y.o. yo [id number] at Unknown was admitted to the hospital 07/09/2018 for scheduled cesarean section with the following indication:Elective Repeat.  Membrane Rupture Time/Date: 8:02 AM ,07/09/2018   Patient delivered a Viable infant.07/09/2018  Details of operation can be found in separate operative note.  Pateint had an uncomplicated postpartum course.  She is ambulating, tolerating a regular diet, passing flatus, and urinating well. Patient is discharged home in stable condition on  07/12/18         Physical exam  Vitals:   07/11/18 0841 07/11/18 1458 07/11/18 2211 07/12/18 0600  BP: 112/72 108/70 112/72 101/73  Pulse: 75 81 72 77  Resp: 18 18 16 18   Temp: 98 F (36.7 C) 98.1 F (36.7 C) 98.1 F (36.7 C) 98 F (36.7 C)  TempSrc:  Oral Oral Oral  SpO2:  100% 99%   Weight:      Height:       General: alert Lochia: appropriate Uterine Fundus: firm Incision: Healing well with no significant drainage  Labs: Lab Results  Component Value Date   WBC 7.7 07/10/2018   HGB 8.3 (L) 07/10/2018   HCT 25.0 (L) 07/10/2018   MCV 92.3 07/10/2018   PLT 167 07/10/2018   CMP Latest Ref Rng & Units 12/19/2017  Glucose 65 - 99 mg/dL 73  BUN 6 - 20 mg/dL 6  Creatinine 0.57 - 1.00 mg/dL 0.54(L)  Sodium 134 - 144 mmol/L 136  Potassium 3.5 - 5.2 mmol/L 4.5  Chloride 96 - 106 mmol/L 101  CO2 20 - 29  mmol/L 23  Calcium 8.7 - 10.2 mg/dL 8.5(L)  Total Protein 6.0 - 8.5 g/dL 6.1  Total Bilirubin 0.0 - 1.2 mg/dL 0.3  Alkaline Phos 39 - 117 IU/L 43  AST 0 - 40 IU/L 11  ALT 0 - 32 IU/L 16    Discharge instruction: per After Visit Summary and "Baby and Me Booklet".  After visit meds:  Allergies as of 07/12/2018      Reactions   Betadine [povidone Iodine] Rash   Latex Itching      Medication List    STOP taking these medications   aspirin EC 81 MG tablet     TAKE these medications   docusate sodium 100 MG capsule Commonly known as: COLACE Take 200 mg by mouth daily.   ibuprofen 600 MG tablet Commonly known as: ADVIL Take 1 tablet (600 mg total) by mouth every 6 (six)  hours as needed for moderate pain.   oxyCODONE 5 MG immediate release tablet Commonly known as: Oxy IR/ROXICODONE Take 1 tablet (5 mg total) by mouth every 4 (four) hours as needed for severe pain.   PRE-NATAL FORMULA PO Take 1 tablet by mouth daily.       Diet: routine diet  Activity: Advance as tolerated. Pelvic rest for 6 weeks.   Outpatient follow up:2 weeks  Newborn Data: Live born female  Birth Weight: 8 lb 6.9 oz (3825 g) APGAR: 22, 9  Newborn Delivery   Birth date/time: 07/09/2018 08:03:00 Delivery type: C-Section, Low Transverse Trial of labor: No C-section categorization: Repeat      Baby Feeding: Breast Disposition:home with mother   07/12/2018 Clarene Duke, MD

## 2018-07-13 NOTE — Anesthesia Postprocedure Evaluation (Signed)
Anesthesia Post Note  Patient: Alexis Carpenter  Procedure(s) Performed: CESAREAN SECTION (N/A Abdomen)     Patient location during evaluation: PACU Anesthesia Type: Spinal Level of consciousness: awake and alert Pain management: pain level controlled Vital Signs Assessment: post-procedure vital signs reviewed and stable Respiratory status: spontaneous breathing and respiratory function stable Cardiovascular status: blood pressure returned to baseline and stable Postop Assessment: spinal receding Anesthetic complications: no    Last Vitals:  Vitals:   07/11/18 2211 07/12/18 0600  BP: 112/72 101/73  Pulse: 72 77  Resp: 16 18  Temp: 36.7 C 36.7 C  SpO2: 99%     Last Pain:  Vitals:   07/12/18 1131  TempSrc:   PainSc: 4                  Tiajuana Amass

## 2018-07-14 ENCOUNTER — Ambulatory Visit: Payer: Self-pay

## 2018-07-14 NOTE — Lactation Note (Signed)
This note was copied from a baby's chart. Lactation Consultation Note  Patient Name: Alexis Carpenter [facility]'N Date: 07/14/2018    07/14/2018  Name: Alexis Carpenter MRN: [id number] Date of Birth: 07/09/2017 Gestational Age: Gestational Age: <None> Birth Weight: 134.9 oz Weight today:    7 pounds 9.6 ounces (3448 grams) with clean newborn diaper  78 day old infant presents today with mom for feeding assessment. Infant has not been latching due to pain and sleepiness at the breast.   Infant has lost 48 grams in the last 2 days. Infant was 7 pounds 11 ounces yesterday at peds office. Infant was very alert in the office today and very willing to feed.   Infant is feeding about every 3 hours with some cluster feeding spells. Mom awakens infant every 3 hours at night, she sometimes wakes up on her own. Enc mom to continue waking at 3 hours if she is not waking herself and to increase supplement of EBM/formula over the next few days. Mom to monitor volume and frequency of stools and voids and to watch for yellow stools.   Infant has not been latching to the breast due to nipple pain in mom. Mom's nipples are a little reddened today. Infant is being supplemented with EBM and formula via Similac nipple. Mom reports sometimes infant is slow at feeding and sometimes fast. Reviewed paced bottle feeding.   Infant with thick labial frenulum that inserts at the bottom of the gum ridge. Upper lip tight with flanging and on the breast. Infant with sucking blister to the upper center lip. Infant with posterior lingual frenulum noted. Infant with some snap back with suckling on gloved feeding. Infant with some decreased mid tongue elevation noted. Infant with good lateralization. Infant disorganized on breast and pulls on and off. Mom's nipples slightly compressed post feeding. Infant sleepy with feedings and appears to have some jaundice coloring. Discussed with mom how tongue and lip restrictions can effect transfer  and milk supply. Mom given handout on websites and local provider. Discussed monitoring and having her evaluated as needed if feedings are not improving.   Infant latched on and off the breast for the feedings. She was sleepy and needed a lot of stimulation to maintain suckling. Infant did pretty well and then supplemented post feeding. Nipple pain did improve with feeding.   Infant to follow up with Dr. Truddie Coco on July 16. Infant to follow up with Lactation as needed at [facility] request. Mom aware of virtual BF Support Groups. Mom to call with questions/concerns as needed.   General Information: Mother's reason for visit: Feeding assessment Consult: Initial Lactation consultant: Nonah Mattes RN,IBCLC Breastfeeding experience: not latching currently Maternal medical conditions: Infertility(Unsure if female or female causes) Maternal medications: Pre-natal vitamin, Motrin (ibuprofen)  Breastfeeding History: Frequency of breast feeding: not latching currently    Supplementation: Supplement method: bottle(Similac extra slow flow nipple) Brand: Similac Formula volume: 2-3 ounces Formula frequency: 40% of feedings   Breast milk volume: 2-3 ounces Breast milk frequency: 60% of feedings   Pump type: Medela pump in style Pump frequency: every 3 hours Pump volume: 2 ounces  Infant Output Assessment: Voids per 24 hours: 6+ Urine color: Clear yellow Stools per 24 hours: 4-5 Stool color: Brown  Breast Assessment: Breast: Soft, Compressible Nipple: Erect Pain level: 9(with latch 9 after latch is much better) Pain interventions: Bra, Other, Expressed breast milk(Nipple Butter)  Feeding Assessment: Infant oral assessment: Variance Infant oral assessment comment: see note Positioning: Cross cradle(right  breast, 20 minutes in 2 sessions) Latch: 1 - Repeated attempts needed to sustain latch, nipple held in mouth throughout feeding, stimulation needed to elicit sucking reflex. Audible  swallowing: 1 - A few with stimulation Type of nipple: 2 - Everted at rest and after stimulation Comfort: 0 - Engorged, cracked, bleeding, large blisters, severe discomfort Hold: 1 - Assistance needed to correctly position infant at breast and maintain latch LATCH score: 5 Latch assessment: Deep Lips flanged: Yes Suck assessment: Displays both Tools: Nipple shield 16 mm Pre-feed weight: 3438 grams/3472 grams Post feed weight: 3448 grams/3482 grams Amount transferred: 20 ml Amount supplemented: 0  Additional Feeding Assessment: Infant oral assessment: Variance Infant oral assessment comment: see note Positioning: Cross cradle Latch: 1 - Repeated attempts neede to sustain latch, nipple held in mouth throughout feeding, stimulation needed to elicit sucking reflex. Audible swallowing: 2 - Spontaneous and intermittent Type of nipple: 2 - Everted at rest and after stimulation Comfort: 0 - Engorged, cracked, bleeding, large blisters, severe discomfort Hold: 1 - Assistance needed to correctly position infant at breast and maintain latch LATCH score: 6 Latch assessment: Deep   Suck assessment: Displays both   Pre-feed weight: 3448 grams Post feed weight: 3472 grams Amount transferred: 24 ml Amount supplemented: 0  Totals: Total amount transferred: 44 ml Total Supplement given: 15 ml EBM via Similac Slow Flow Nipple Total amount pumped post feed: did not pump   Plan:   1. Offer infant the breast with feeding cues, make sure infant feeds at least every 3 hours until she is back at birthweight. Breast feed infant for 20-30 minutes and then offer her a bottle of milk. Attempt to breast feed about 3-4 x a day and increase as infant is able. 2. Keep infant awake at the breast with feedings as needed, feed her Skin to skin 3. Massage/compress breast with feeding when she slows with her feeding 4. Use the # 24 Nipple Shield with feedings as needed for painful latch. The goal is to wean off  of the nipple shield as soon as mom and infant able.  5. Continue to offer infant a bottle of pumped breast milk after breast feeding until she is more active at the breast. Start with one ounce and increase as infant wants.  6. Feed infant using the paced bottle feeding method (video on [email or web address]). Can use your [place]mie [place] bottles with the extra slow flow nipple to feed her. 7. Infant needs about 64-85 ml (2-3 ounces) for 8 feedings a day by 7 days of life or 510-680 ml (17-23 ounces) in 24 hours. Infant may eat more or less depending on how often she feeds. Feed her until she is satisfied.  8. Continue pumping about every 3 hours to protect your milk supply until infant is more active at the breast. Use your double electric breast pump and use your hands free bra to pump. Massage breast with feedings.  9. Keep up the good work 10. Thank you for allowing me to assist you today 11. Please call with any questions/concerns as needed 12. Follow up with Lactation as needed    Donn Pierini RN, IBCLC                                                    Debby Freiberg Meeah Totino 07/14/2018, 9:28 AM

## 2018-12-14 ENCOUNTER — Other Ambulatory Visit: Payer: Self-pay

## 2018-12-14 DIAGNOSIS — Z20822 Contact with and (suspected) exposure to covid-19: Secondary | ICD-10-CM

## 2018-12-15 LAB — NOVEL CORONAVIRUS, NAA: SARS-CoV-2, NAA: NOT DETECTED

## 2019-04-20 ENCOUNTER — Telehealth: Payer: Self-pay | Admitting: Family Medicine

## 2019-04-20 NOTE — Telephone Encounter (Signed)
Spoke to pt advised that the symptoms are normal . Pt was also advise if she is still having this post the 14 days to call back to office. [place]

## 2019-04-20 NOTE — Telephone Encounter (Signed)
Pt called and said she got her 2nd dose of the vaccine about a week ago and is having brain fog,chills,tiredness. She is wondering should she be having these symptoms so late after vaccine

## 2019-09-08 ENCOUNTER — Encounter: Payer: Self-pay | Admitting: Family Medicine

## 2019-12-06 ENCOUNTER — Ambulatory Visit: Payer: Self-pay | Attending: Internal Medicine

## 2019-12-06 DIAGNOSIS — Z23 Encounter for immunization: Secondary | ICD-10-CM

## 2019-12-06 NOTE — Progress Notes (Signed)
   Covid-19 Vaccination Clinic  Name:  Dekayla Prestridge    MRN: [id number] DOB: 25-Nov-1980  12/06/2019  Ms. Moylan was observed post Covid-19 immunization for 15 minutes without incident. She was provided with Vaccine Information Sheet and instruction to access the V-Safe system.   Ms. Kuehnel was instructed to call 911 with any severe reactions post vaccine: Marland Kitchen Difficulty breathing  . Swelling of face and throat  . A fast heartbeat  . A bad rash all over body  . Dizziness and weakness   Immunizations Administered    Name Date Dose VIS Date Route   Pfizer COVID-19 Vaccine 12/06/2019  1:12 PM 0.3 mL 10/27/2019 Intramuscular   Manufacturer: [place]   Lot: [id number]   [place]: [address]-4

## 2020-12-29 ENCOUNTER — Ambulatory Visit (INDEPENDENT_AMBULATORY_CARE_PROVIDER_SITE_OTHER): Payer: BC Managed Care – PPO | Admitting: Family Medicine

## 2020-12-29 DIAGNOSIS — K30 Functional dyspepsia: Secondary | ICD-10-CM

## 2020-12-29 NOTE — Progress Notes (Signed)
° °  Subjective:    Patient ID: Alexis Carpenter, female    DOB: 12/16/1980, 40 y.o.   MRN: [id number]  HPI She complains of a 1 week history of chest aching that lasts less than a minute and is intermittent in nature.  She cannot relate this to eating, breathing, motion but has been doing some upper body exercises.  She also complains of some indigestion and did say that Tums has helped this.   Review of Systems     Objective:   Physical Exam Alert and in no distress.  No palpable chest wall tenderness is noted.  Neck is supple without adenopathy.  Cardiac and lung exams are normal.       Assessment & Plan:  Indigestion I explained that her symptoms are nondiagnostic.  Did recommend to Tylenol 4 times per day as well as either Prilosec or Nexium on a regular basis.  She is also to pay attention to anything and make the symptoms better or worse.  Did discuss multiple issues behind causing this none of which are really specific enough.  She will keep me informed.  Recommend she do this for about a week.

## 2021-02-23 ENCOUNTER — Telehealth (INDEPENDENT_AMBULATORY_CARE_PROVIDER_SITE_OTHER): Payer: BC Managed Care – PPO | Admitting: Physician Assistant

## 2021-02-23 ENCOUNTER — Encounter: Payer: Self-pay | Admitting: Physician Assistant

## 2021-02-23 ENCOUNTER — Other Ambulatory Visit: Payer: Self-pay

## 2021-02-23 ENCOUNTER — Other Ambulatory Visit (INDEPENDENT_AMBULATORY_CARE_PROVIDER_SITE_OTHER): Payer: BC Managed Care – PPO

## 2021-02-23 VITALS — Temp 98.2°F | Wt 145.0 lb

## 2021-02-23 DIAGNOSIS — L814 Other melanin hyperpigmentation: Secondary | ICD-10-CM | POA: Insufficient documentation

## 2021-02-23 DIAGNOSIS — D485 Neoplasm of uncertain behavior of skin: Secondary | ICD-10-CM | POA: Insufficient documentation

## 2021-02-23 DIAGNOSIS — J029 Acute pharyngitis, unspecified: Secondary | ICD-10-CM

## 2021-02-23 DIAGNOSIS — L730 Acne keloid: Secondary | ICD-10-CM | POA: Insufficient documentation

## 2021-02-23 DIAGNOSIS — D2230 Melanocytic nevi of unspecified part of face: Secondary | ICD-10-CM | POA: Insufficient documentation

## 2021-02-23 DIAGNOSIS — G40909 Epilepsy, unspecified, not intractable, without status epilepticus: Secondary | ICD-10-CM | POA: Insufficient documentation

## 2021-02-23 DIAGNOSIS — D1801 Hemangioma of skin and subcutaneous tissue: Secondary | ICD-10-CM | POA: Insufficient documentation

## 2021-02-23 LAB — POCT RAPID STREP A (OFFICE): Rapid Strep A Screen: NEGATIVE

## 2021-02-23 NOTE — Progress Notes (Signed)
°  Virtual Visit via Video Note   Patient ID: Alexis Carpenter, female    DOB: 10/31/1980, 41 y.o.   MRN: [id number]  I connected with above patient on 02/23/21 by a video enabled telemedicine application and verified that I am speaking with the correct person using two identifiers.  Location: Patient: home Provider: office   I discussed the limitations of evaluation and management by telemedicine and the availability of in person appointments. The patient expressed understanding and agreed to proceed.  History of Present Illness:  Chief Complaint  Patient presents with   Sore Throat    Only swallowing   Dry cough due to throat    Patient reports a 4 day history of a sore throat worse with swallowing; seems worse at the end of the day; states her throat feels dry and drinking water helps throat pain; took 1 dose of OTC Tylenol and it was helpful:  Denies fever / chills / nausea / vomiting / diarrhea / constipation; denies sick contacts; denies out of state or out of country travel; appetite is normal; denies head congestion or a history of allergies; has an occasional non-productive cough.    Observations/Objective:  Temp 98.2 F (36.8 C)    Wt 145 lb (65.8 kg)    LMP 02/05/2021 (Exact Date)    BMI 23.40 kg/m    Assessment: Encounter Diagnosis  Name Primary?   Pharyngitis, unspecified etiology Yes     Plan:  Increase rest and liquids, OTC Tylenol (generic is acetamenophen), Advil or Motrin (generic is ibuprofen) ALWAYS TAKE WITH FOOD, Aleve (generic is naprosyn sodium) ALWAYS TAKE WITH FOOD, warm salt water gargle, OTC throat spray, cough drops as needed.  Patient will come to the office for a rapid strep test this afternoon. The rapid strep test was negative which means you DO NOT have a bacterial infection. Increase rest and liquids, OTC Tylenol (generic is acetamenophen), Advil or Motrin (generic is ibuprofen) ALWAYS TAKE WITH FOOD, Aleve (generic is naprosyn sodium)  ALWAYS TAKE WITH FOOD, warm salt water gargle, OTC throat spray, cough drops as needed.   If your symptoms get worse please go to Urgent Care for further evaluation.    [name] was seen today for sore throat.  Diagnoses and all orders for this visit:  Pharyngitis, unspecified etiology -     Cancel: Rapid Strep A -     Rapid Strep A; Future    Follow up: If your symptoms get worse, please call 911 / EMS for help or go to the Emergency Department for further evaluation    I discussed the assessment and treatment plan with the patient. The patient was provided an opportunity to ask questions and all were answered. The patient agreed with the plan and demonstrated an understanding of the instructions.   The patient was advised to call back or seek an in-person evaluation if the symptoms worsen or if the condition fails to improve as anticipated.  I spent 15 minutes dedicated to the care of this patient, including pre-visit review of records, face to face time, post-visit ordering of testing and documentation.    Irene Pap, PA-C

## 2021-03-21 ENCOUNTER — Other Ambulatory Visit: Payer: Self-pay

## 2021-03-21 ENCOUNTER — Encounter: Payer: Self-pay | Admitting: Family Medicine

## 2021-03-21 ENCOUNTER — Ambulatory Visit (INDEPENDENT_AMBULATORY_CARE_PROVIDER_SITE_OTHER): Payer: BC Managed Care – PPO | Admitting: Family Medicine

## 2021-03-21 VITALS — BP 100/70 | HR 90 | Temp 98.5°F | Ht 65.0 in | Wt 145.6 lb

## 2021-03-21 DIAGNOSIS — J069 Acute upper respiratory infection, unspecified: Secondary | ICD-10-CM | POA: Diagnosis not present

## 2021-03-21 NOTE — Progress Notes (Signed)
? ?  Subjective:  ? ? Patient ID: Alexis Carpenter, female    DOB: [birth year], 41 y.o.   MRN: [id number] ? ?HPI ?She states that recently her children had viral symptoms of mainly a cough that then she developed the same symptom.  No fever, chills, sore throat or earache.  She went on a business trip to [facility] and her symptoms got worse with more chest tightness.  She was seen in an urgent care center and given a steroid Dosepak as well as albuterol inhaler.  Presently she is doing much better only occasionally having a cough.  She has no underlying history of allergies or asthma and does not smoke.  She is on the last day of her steroid Dosepak. ? ? ?Review of Systems ? ?   ?Objective:  ? Physical Exam ?Alert and in no distress. Tympanic membranes and canals are normal. Pharyngeal area is normal. Neck is supple without adenopathy or thyromegaly. Cardiac exam shows a regular sinus rhythm without murmurs or gallops. Lungs are clear to auscultation. ? ? ? ? ?   ?Assessment & Plan:  ?Viral URI with cough ?I explained that I did not think that the wheezing that she was having is anything significant and probably related to the underlying URI.  Since she has no underlying history of allergies and asthma does not smoke I explained that I do not think that this would indicate need for follow-up concerning allergies and asthma.  She was comfortable with that. ? ?

## 2021-03-30 ENCOUNTER — Ambulatory Visit (INDEPENDENT_AMBULATORY_CARE_PROVIDER_SITE_OTHER): Payer: BC Managed Care – PPO | Admitting: Family Medicine

## 2021-03-30 ENCOUNTER — Other Ambulatory Visit: Payer: Self-pay

## 2021-03-30 VITALS — BP 120/70 | HR 72 | Temp 99.1°F | Resp 16 | Wt 146.6 lb

## 2021-03-30 DIAGNOSIS — R0789 Other chest pain: Secondary | ICD-10-CM

## 2021-03-30 NOTE — Progress Notes (Signed)
? ?  Subjective:  ? ? Patient ID: Alexis Carpenter, female    DOB: [birth year], 41 y.o.   MRN: [id number] ? ?HPI ?Since last being seen she said she started feeling much better until last night when she felt an intermittent pressure sensation in her chest.  She has had no coughing, fever, chills, relationship to eating. ? ? ?Review of Systems ? ?   ?Objective:  ? Physical Exam ?Alert and in no distress.  She has some slight left-sided chest wall tenderness.  Tympanic membranes and canals are normal. Pharyngeal area is normal. Neck is supple without adenopathy or thyromegaly. Cardiac exam shows a regular sinus rhythm without murmurs or gallops. Lungs are clear to auscultation. ? ? ? ? ?   ?Assessment & Plan:  ?Chest pressure ?I explained that the chest pressure is difficult to assess but could be GI in nature.  Explained the fact that since its intermittent, it is probably unlikely to be allergy/lung related.  She did kind of allude to the fact that it might be indigestion related but was vague about this.  Recommend that she use Nexium regularly for the next 2 weeks and pay attention to anything that might be occurring when she has another 1 of these episodes since it does seem to be intermittent.  Also explained that I did not think this was allergy related since again there is no relation to any allergy related symptoms ? ?

## 2021-05-11 ENCOUNTER — Ambulatory Visit (INDEPENDENT_AMBULATORY_CARE_PROVIDER_SITE_OTHER): Payer: BC Managed Care – PPO | Admitting: Physician Assistant

## 2021-05-11 VITALS — BP 110/70 | HR 81 | Ht 65.0 in | Wt 149.4 lb

## 2021-05-11 DIAGNOSIS — M25521 Pain in right elbow: Secondary | ICD-10-CM | POA: Diagnosis not present

## 2021-05-11 DIAGNOSIS — S63501A Unspecified sprain of right wrist, initial encounter: Secondary | ICD-10-CM

## 2021-05-11 NOTE — Patient Instructions (Addendum)
You can walk in for x-ray at --- ? ?Diagnostic Radiology and Imaging ? ?[facility] ?[address] ?[address] ?[place], [place] [address] ? ?Phone [phone] ?Fax [phone] ? ?Hours of Operation ?General hours of operation are Monday - Friday, 8 am-5 pm  ? ? ?------------------------------------------------------------------------------- ?You can take OTC pain medicine as needed: ? ?Tylenol (generic is acetamenophen) ? ?Advil or Motrin (generic is ibuprofen) ALWAYS TAKE WITH FOOD ?Aleve (generic is naprosyn sodium) ALWAYS TAKE WITH FOOD ? ?Aspercreme with lidocaine ?Muscle rubs like Biofreeze, IcyHot, Bengay, pain patches like SalonPas ? ?Voltaren gel (generic is Diclofenac sodium)  ? ? ?Sprains can take 6 - 8 weeks to completely heal ?It should feel better each week ?Your body will heal itself ?Rest as needed and let pain be your guide for what you can and cannot do ?Take OTC NSAIDS with food which help with pain and inflammation ? ? ?

## 2021-05-11 NOTE — Progress Notes (Signed)
? ?Acute Office Visit ? ?Subjective:  ? ? Patient ID: Alexis Carpenter, female    DOB: March 25, 1980, 41 y.o.   MRN: [id number] ? ?Chief Complaint  ?Patient presents with  ? Acute Visit  ?  Pain in right arm that started 3 weeks ago when she hit her elbow.  ? ? 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 ? ? ?Outpatient Medications Prior to Visit  ?Medication Sig Dispense Refill  ? B Complex-C (B-COMPLEX WITH VITAMIN C) tablet Take 1 tablet by mouth daily.    ? Multiple Vitamins-Calcium (ONE-A-DAY WOMENS FORMULA PO) Take by mouth.    ? albuterol (VENTOLIN HFA) 108 (90 Base) MCG/ACT inhaler albuterol sulfate HFA 90 mcg/actuation aerosol inhaler ? INHALE ONE PUFF AS NEEDED EVERY 4 HOURS X 30 DAYS (Patient not taking: Reported on 05/11/2021)    ? ?No facility-administered medications prior to visit.  ? ? ?Allergies  ?Allergen Reactions  ? Povidone-Iodine Hives and Other (See Comments)  ? Betadine [Povidone Iodine] Rash  ? Latex Itching  ? ? ?Review of Systems  ?Constitutional:  Negative for activity change and chills.  ?HENT:  Negative for congestion and voice change.   ?Eyes:  Negative for pain and redness.  ?Respiratory:  Negative for cough and wheezing.   ?Cardiovascular:  Negative for chest pain.  ?Gastrointestinal:  Negative for constipation, diarrhea, nausea and vomiting.  ?Endocrine: Negative for polyuria.  ?Genitourinary:  Negative for frequency.   ?Musculoskeletal:  Positive for myalgias.  ?Skin:  Negative for color change and rash.  ?Allergic/Immunologic: Negative for immunocompromised state.  ?Neurological:  Negative for dizziness.  ?Psychiatric/Behavioral:  Negative for agitation.   ? ?   ?Objective:  ?  ?Physical Exam ?Vitals and nursing note reviewed.  ?Constitutional:   ?   General: She is not in acute distress. ?   Appearance: Normal appearance. She is not ill-appearing.  ?HENT:  ?   Head: Normocephalic and atraumatic.  ?   Right Ear: External ear normal.  ?   Left Ear: External ear normal.  ?   Nose: No congestion.  ?Eyes:  ?   Extraocular Movements: Extraocular movements intact.  ?   Conjunctiva/sclera: Conjunctivae normal.  ?   Pupils: Pupils are equal, round, and reactive to light.  ?Cardiovascular:  ?   Rate and Rhythm: Normal rate and regular rhythm.  ?   Pulses: Normal pulses.  ?   Heart sounds: Normal heart sounds.  ?Pulmonary:  ?   Effort: Pulmonary effort is normal.  ?   Breath sounds: Normal breath sounds. No wheezing.  ?Abdominal:  ?   General: Bowel sounds are normal.  ?   Palpations: Abdomen is soft.  ?Musculoskeletal:     ?   General: Normal range of motion.  ?   Cervical back: Normal range of motion and neck supple.  ?   Right lower  leg: No edema.  ?   Left lower leg: No edema.  ?Skin: ?   General: Skin is warm and dry.  ?   Findings: No bruising.  ?Neurological:  ?   General: No focal deficit present.  ?   Mental Status: She is alert and oriented to person, place, and time.  ?Psychiatric:     ?   Mood and Affect: Mood normal.     ?   Behavior: Behavior normal.     ?   Thought Content: Thought content normal.  ? ? ?BP 110/70   Pulse 81   Ht '5\' 5"'$  (1.651 m)   Wt 149 lb 6.4 oz (67.8 kg)   SpO2 98%   BMI 24.86 kg/m?  ? ?Wt Readings from Last 3 Encounters:  ?05/11/21 149 lb 6.4 oz (67.8 kg)  ?03/30/21 146 lb 9.6 oz (66.5 kg)  ?03/21/21 145 lb 9.6 oz (66 kg)  ? ? ?Results for orders placed or performed in visit on 02/23/21  ?Rapid  Strep A  ?Result Value Ref Range  ? Rapid Strep A Screen Negative Negative  ? ? ?   ?Assessment & Plan:  ?1. Right elbow pain ?- DG Elbow Complete Right; Future ?- NSAIDS with food as needed ? ?2. Forearm sprain, right, initial encounter ?- Sprains can take 6 - 8 weeks to completely heal ?It should feel better each week ?Your body will heal itself ?Rest as needed and let pain be your guide for what you can and cannot do ?Take OTC NSAIDS with food which help with pain and inflammation ? ? ?No orders of the defined types were placed in this encounter. ? ? ?Return for Return for Annual Exam with PCP [employer]. ? ?Irene Pap, PA-C ?

## 2021-05-14 ENCOUNTER — Encounter: Payer: Self-pay | Admitting: Physician Assistant

## 2021-07-28 ENCOUNTER — Emergency Department (HOSPITAL_BASED_OUTPATIENT_CLINIC_OR_DEPARTMENT_OTHER)
Admission: EM | Admit: 2021-07-28 | Discharge: 2021-07-28 | Disposition: A | Discharge status: Home / Self Care | Payer: BC Managed Care – PPO | Attending: Emergency Medicine | Admitting: Emergency Medicine

## 2021-07-28 ENCOUNTER — Other Ambulatory Visit: Payer: Self-pay

## 2021-07-28 ENCOUNTER — Encounter (HOSPITAL_BASED_OUTPATIENT_CLINIC_OR_DEPARTMENT_OTHER): Payer: Self-pay | Admitting: Emergency Medicine

## 2021-07-28 DIAGNOSIS — R0789 Other chest pain: Secondary | ICD-10-CM | POA: Diagnosis present

## 2021-07-28 DIAGNOSIS — F17200 Nicotine dependence, unspecified, uncomplicated: Secondary | ICD-10-CM | POA: Insufficient documentation

## 2021-07-28 DIAGNOSIS — R0602 Shortness of breath: Secondary | ICD-10-CM | POA: Insufficient documentation

## 2021-07-28 DIAGNOSIS — Z91040 Latex allergy status: Secondary | ICD-10-CM | POA: Insufficient documentation

## 2021-07-28 LAB — BASIC METABOLIC PANEL
Anion gap: 11 (ref 5–15)
BUN: 10 mg/dL (ref 6–20)
CO2: 25 mmol/L (ref 22–32)
Calcium: 9.2 mg/dL (ref 8.9–10.3)
Chloride: 104 mmol/L (ref 98–111)
Creatinine, Ser: 0.78 mg/dL (ref 0.44–1.00)
GFR, Estimated: >60 mL/min (ref >60)
Glucose, Bld: 96 mg/dL (ref 70–99)
Potassium: 3.6 mmol/L (ref 3.5–5.1)
Sodium: 140 mmol/L (ref 135–145)

## 2021-07-28 LAB — D-DIMER, QUANTITATIVE: D-Dimer, Quant: <0.27 ug/mL-FEU (ref 0.00–0.50)

## 2021-07-28 LAB — CBC
HCT: 37.5 % (ref 36.0–46.0)
Hemoglobin: 12.6 g/dL (ref 12.0–15.0)
MCH: 30.9 pg (ref 26.0–34.0)
MCHC: 33.6 g/dL (ref 30.0–36.0)
MCV: 91.9 fL (ref 80.0–100.0)
Platelets: 267 10*3/uL (ref 150–400)
RBC: 4.08 MIL/uL (ref 3.87–5.11)
RDW: 12.2 % (ref 11.5–15.5)
WBC: 5.2 10*3/uL (ref 4.0–10.5)
nRBC: 0 % (ref 0.0–0.2)

## 2021-07-28 LAB — TROPONIN I (HIGH SENSITIVITY)
Troponin I (High Sensitivity): <2 ng/L (ref <18)
Troponin I (High Sensitivity): <2 ng/L (ref <18)

## 2021-07-28 NOTE — ED Triage Notes (Signed)
Pt sent here from [facility] for a work up for sob and some chest heaviness , x ray done at [facility]  which was clear

## 2021-07-28 NOTE — ED Notes (Signed)
RT obtained blood sample from pt for labs.

## 2021-07-28 NOTE — ED Provider Notes (Signed)
[facility] EMERGENCY DEPT Provider Note   CSN: [id number] Arrival date & time: 07/28/21  1701     History  No chief complaint on file.   Tiphanie Vo is a 41 y.o. female.  Patient is a 41 year old female who presents with chest tightness and shortness of breath.  She reports that in February she had a bronchitis and had some tightness in the right side of her chest.  She was treated with steroids and given albuterol inhaler.  Since that time she has been having intermittent tightness mostly in the right side of her chest.  Her symptoms got a little worse this week.  She has been a did trip to the beach about 3 hours away.  She describes it as a tightness to the right chest and the right upper back.  It is a little bit worse with deep breathing.  It is nonexertional.  She sometimes has some shortness of breath or just feels like she cannot get a full breath or needs to exhale more completely.  She has not had any notable wheezing.  She has at times used her inhaler which does seem to help.  At 1 point she had some increased pain in the center of her chest and was started on antiacid medications by her PCP which seemed to help.  She denies any prior cardiac disease.  No leg pain or swelling.  No cough or cold symptoms.  No fevers.  She has a remote history of smoking but no current smoking.       Home Medications Prior to Admission medications   Medication Sig Start Date End Date Taking? Authorizing Provider  B Complex-C (B-COMPLEX WITH VITAMIN C) tablet Take 1 tablet by mouth daily.    [provider]  Multiple Vitamins-Calcium (ONE-A-DAY WOMENS FORMULA PO) Take by mouth.    [provider]      Allergies    Povidone-iodine, Betadine [povidone iodine], and Latex    Review of Systems   Review of Systems  Constitutional:  Negative for chills, diaphoresis, fatigue and fever.  HENT:  Negative for congestion, rhinorrhea and sneezing.   Eyes:  Negative.   Respiratory:  Positive for chest tightness and shortness of breath. Negative for cough.   Cardiovascular:  Negative for chest pain and leg swelling.  Gastrointestinal:  Negative for abdominal pain, blood in stool, diarrhea, nausea and vomiting.  Genitourinary:  Negative for difficulty urinating, flank pain, frequency and hematuria.  Musculoskeletal:  Negative for arthralgias and back pain.  Skin:  Negative for rash.  Neurological:  Negative for dizziness, speech difficulty, weakness, numbness and headaches.    Physical Exam Updated Vital Signs BP 116/76   Pulse 85   Temp 98.7 F (37.1 C)   Resp 15   Ht '5\' 5"'$  (1.651 m)   Wt 67.6 kg   SpO2 100%   BMI 24.79 kg/m  Physical Exam Constitutional:      Appearance: She is well-developed.  HENT:     Head: Normocephalic and atraumatic.  Eyes:     Pupils: Pupils are equal, round, and reactive to light.  Cardiovascular:     Rate and Rhythm: Normal rate and regular rhythm.     Heart sounds: Normal heart sounds.  Pulmonary:     Effort: Pulmonary effort is normal. No respiratory distress.     Breath sounds: Normal breath sounds. No wheezing or rales.  Chest:     Chest wall: No tenderness.  Abdominal:  General: Bowel sounds are normal.     Palpations: Abdomen is soft.     Tenderness: There is no abdominal tenderness. There is no guarding or rebound.  Musculoskeletal:        General: Normal range of motion.     Cervical back: Normal range of motion and neck supple.     Comments: No edema or calf tenderness  Lymphadenopathy:     Cervical: No cervical adenopathy.  Skin:    General: Skin is warm and dry.     Findings: No rash.  Neurological:     Mental Status: She is alert and oriented to person, place, and time.     ED Results / Procedures / Treatments   Labs (all labs ordered are listed, but only abnormal results are displayed) Labs Reviewed  BASIC METABOLIC PANEL  CBC  D-DIMER, QUANTITATIVE  TROPONIN I (HIGH  SENSITIVITY)  TROPONIN I (HIGH SENSITIVITY)    EKG EKG Interpretation  Date/Time:  Saturday July 28 2021 17:13:10 EDT Ventricular Rate:  85 PR Interval:  148 QRS Duration: 96 QT Interval:  368 QTC Calculation: 437 R Axis:   42 Text Interpretation: Normal sinus rhythm with sinus arrhythmia Incomplete right bundle branch block Borderline ECG No previous ECGs available Confirmed by Malvin Johns [phone]) on 07/28/2021 7:33:10 PM  Radiology No results found.  Procedures Procedures    Medications Ordered in ED Medications - No data to display  ED Course/ Medical Decision Making/ A&P                           Medical Decision Making Amount and/or Complexity of Data Reviewed Labs: ordered.   Patient is a 41 year old female who presents with tightness in the right side of her chest.  Its been intermittent since February but seem to get worse this week.  No ischemic changes are noted on EKG.  She has had 2 negative troponins.  Chest x-ray was done at the outside facility.  I reviewed the results and her epic chart and it does not show any acute abnormalities.  No pneumonia or pneumothorax.  Her other lab is nonconcerning.  There is no signs of pulmonary edema or CHF.  Her D-dimer is negative and no other suggestions of PE.  No hypoxia.  She is low risk for ACS.  She was discharged home in good condition.  She was encouraged to have close follow-up with her PCP.  Return precautions were given.  Final Clinical Impression(s) / ED Diagnoses Final diagnoses:  Chest tightness    Rx / DC Orders ED Discharge Orders     None         Malvin Johns, MD 07/28/21 2150

## 2021-07-31 ENCOUNTER — Telehealth: Payer: Self-pay

## 2021-07-31 NOTE — Telephone Encounter (Signed)
I called pt. Per pt. Ping report she was recently in the ED for chest pain. I had to LM for pt. To call back.

## 2021-08-28 ENCOUNTER — Telehealth: Payer: Self-pay | Admitting: Family Medicine

## 2021-08-28 NOTE — Telephone Encounter (Signed)
Pt states she went to ED regarding same symptoms that she saw you for, took the heart burn medicine you told her to.  Has dry cough, chest tight, feels like she can't get deep breath in, ED did work up for heart issues and all normal.  Did CXR normal.  Offered appointment but wants to know what you suggest next, would like referral somewhere because this has been going on for months.

## 2021-08-29 ENCOUNTER — Other Ambulatory Visit: Payer: Self-pay

## 2021-08-29 DIAGNOSIS — R0789 Other chest pain: Secondary | ICD-10-CM

## 2021-08-29 DIAGNOSIS — R053 Chronic cough: Secondary | ICD-10-CM

## 2021-08-29 NOTE — Telephone Encounter (Signed)
Order placed and pt was advised [facility]

## 2021-08-30 NOTE — Telephone Encounter (Signed)
done

## 2021-09-11 ENCOUNTER — Telehealth: Payer: Self-pay | Admitting: Student

## 2021-09-11 NOTE — Telephone Encounter (Signed)
Appt was moved and requested slot blocked.

## 2021-09-11 NOTE — Telephone Encounter (Signed)
Apologies for late notice but the only time I could schedule an urgent bronch was 9/8 at noon at [facility]. Would it be possible for Alexis Carpenter to be rescheduled next available or see another provider? I will also need to block 11:30 slot.   Thanks!

## 2021-09-12 ENCOUNTER — Encounter: Payer: Self-pay | Admitting: Internal Medicine

## 2021-09-14 ENCOUNTER — Institutional Professional Consult (permissible substitution): Payer: BC Managed Care – PPO | Admitting: Student

## 2021-10-04 ENCOUNTER — Ambulatory Visit (INDEPENDENT_AMBULATORY_CARE_PROVIDER_SITE_OTHER): Payer: BC Managed Care – PPO | Admitting: Pulmonary Disease

## 2021-10-04 ENCOUNTER — Encounter: Payer: Self-pay | Admitting: Pulmonary Disease

## 2021-10-04 VITALS — BP 108/68 | HR 74 | Ht 66.0 in | Wt 150.0 lb

## 2021-10-04 DIAGNOSIS — R053 Chronic cough: Secondary | ICD-10-CM

## 2021-10-04 NOTE — Patient Instructions (Signed)
Coughing, chest discomfort  With improvement in symptoms, no other further testing needs done at present  Avoid known allergens  With exposure, expectation is that you will have a response-you may use your albuterol and Symbicort around this period, To help control symptoms  I will see you just as needed  You are welcome to call with any concerns

## 2021-10-04 NOTE — Progress Notes (Signed)
Alexis Carpenter    [id number]    10/24/1980  Primary Care Physician:Lalonde, Elyse Jarvis, MD  Referring Physician: Denita Lung, [place] [address] [place],  [place] [address]  Chief complaint:   Patient being seen for cough, wheezing, chest discomfort  HPI:  Symptoms are improving over the last month  She did see allergies 9/27-had spirometry performed, allergy testing -Found to be allergic to mold, grass, weed -Prescribed Symbicort to be used as needed  Recently used albuterol, last use of albuterol was a couple of months ago  Symptoms started about February when children had a respite tract infection, she contracted the same and had a cough that lasted a while, she did have a dry cough with wheezing.  She was treated with a course of steroids and albuterol She is at recurrence of her symptoms for which she sought treatment as well.  No history of lung disease  Social smoking, quit age 54  No significant history of any health problems  No occupational exposures  She noticed recently that drinking coffee is associated with some chest discomfort  Outpatient Encounter Medications as of 10/04/2021  Medication Sig   albuterol (VENTOLIN HFA) 108 (90 Base) MCG/ACT inhaler Inhale into the lungs as needed.   B Complex-C (B-COMPLEX WITH VITAMIN C) tablet Take 1 tablet by mouth daily.   budesonide-formoterol (SYMBICORT) 80-4.5 MCG/ACT inhaler Inhale 2 puffs into the lungs 2 (two) times daily.   Multiple Vitamins-Calcium (ONE-A-DAY WOMENS FORMULA PO) Take by mouth.   [DISCONTINUED] SYMBICORT 80-4.5 MCG/ACT inhaler Inhale into the lungs.   No facility-administered encounter medications on file as of 10/04/2021.    Allergies as of 10/04/2021 - Review Complete 10/04/2021  Allergen Reaction Noted   Povidone-iodine Hives and Other (See Comments) 11/03/2020   Betadine [povidone iodine] Rash 11/02/2015   Latex Itching 12/18/2016    Past Medical History:   Diagnosis Date   Family history of breast cancer    Family history of cancer of extrahepatic bile ducts    Family history of non-Hodgkin's lymphoma    Status post repeat low transverse cesarean section 07/09/2018    Past Surgical History:  Procedure Laterality Date   CESAREAN SECTION     CESAREAN SECTION N/A 07/09/2018   Procedure: CESAREAN SECTION;  Surgeon: Sherlyn Hay, DO;  Location: [facility];  Service: Obstetrics;  Laterality: N/A;  Nira Conn, RNFA   DILATION AND EVACUATION N/A 11/02/2015   Procedure: DILATATION AND EVACUATION;  Surgeon: Sherlyn Hay, DO;  Location: [facility];  Service: Gynecology;  Laterality: N/A;   WISDOM TOOTH EXTRACTION      Family History  Problem Relation Age of Onset   Lymphoma Maternal Aunt 16       Hodgkin's lymphoma   Breast cancer Maternal Aunt 66   Cancer Maternal Aunt        bile duct cancer dx in her 63s   Heart attack Maternal Grandfather    Lymphoma Paternal Grandmother        NHL   Diabetes Paternal Grandfather    Pneumonia Paternal Grandfather    Lymphoma Brother 59       NHL   Breast cancer Maternal Aunt        >50   Lung cancer Maternal Aunt        mother's identical twin sister; smoker   Cervical cancer Maternal Aunt    Lymphoma Maternal Uncle        dx 40s-50s.  NHL    Social History   Socioeconomic History   Marital status: Married    Spouse name: Not on file   Number of children: Not on file   Years of education: Not on file   Highest education level: Not on file  Occupational History   Not on file  Tobacco Use   Smoking status: Former   Smokeless tobacco: Never  Vaping Use   Vaping Use: Never used  Substance and Sexual Activity   Alcohol use: Yes    Alcohol/week: 2.0 standard drinks of alcohol    Types: 2 Glasses of wine per week   Drug use: No   Sexual activity: Yes  Other Topics Concern   Not on file  Social History Narrative   Not on file   Social Determinants of Health   Financial  Resource Strain: Low Risk  (06/25/2018)   Overall Financial Resource Strain (CARDIA)    Difficulty of Paying Living Expenses: Not hard at all  Food Insecurity: No Food Insecurity (06/25/2018)   Hunger Vital Sign    Worried About Running Out of Food in the Last Year: Never true    [place] in the Last Year: Never true  Transportation Needs: Unknown (06/25/2018)   PRAPARE - Hydrologist (Medical): No    Lack of Transportation (Non-Medical): Not on file  Physical Activity: Not on file  Stress: No Stress Concern Present (06/25/2018)   [place]    Feeling of Stress : Only a little  Social Connections: Not on file  Intimate Partner Violence: Not At Risk (06/25/2018)   Humiliation, Afraid, Rape, and Kick questionnaire    Fear of Current or Ex-Partner: No    Emotionally Abused: No    Physically Abused: No    Sexually Abused: No    Review of Systems  Respiratory:  Positive for shortness of breath and wheezing.     Vitals:   10/04/21 1100  BP: 108/68  Pulse: 74  SpO2: 99%     Physical Exam Constitutional:      Appearance: Normal appearance.  HENT:     Head: Normocephalic.     Mouth/Throat:     Mouth: Mucous membranes are moist.  Cardiovascular:     Rate and Rhythm: Normal rate and regular rhythm.     Heart sounds: No murmur heard. Pulmonary:     Effort: No respiratory distress.     Breath sounds: No stridor. No wheezing or rhonchi.  Musculoskeletal:     Cervical back: No rigidity or tenderness.  Neurological:     Mental Status: She is alert.  Psychiatric:        Mood and Affect: Mood normal.    Data Reviewed: Recent chest x-ray July 2023 in care everywhere negative with no acute infiltrate  Spirometry was normal at allergist office Thank you   Assessment:   Coughing, chest discomfort  Possible cough variant asthma  Encourage albuterol and Symbicort use as  tolerated   Plan/Recommendations: No further testing at present  I will see her as needed  Encouraged to call with any significant concerns   Sherrilyn Rist MD  Pulmonary and Critical Care 10/04/2021, 11:17 AM  CC: Denita Lung, MD

## 2021-10-08 ENCOUNTER — Encounter: Payer: Self-pay | Admitting: Pulmonary Disease

## 2021-10-16 ENCOUNTER — Encounter: Payer: Self-pay | Admitting: Internal Medicine

## 2021-10-29 ENCOUNTER — Encounter: Payer: Self-pay | Admitting: Internal Medicine

## 2021-11-20 ENCOUNTER — Telehealth: Payer: BC Managed Care – PPO | Admitting: Medical

## 2021-11-23 ENCOUNTER — Ambulatory Visit (INDEPENDENT_AMBULATORY_CARE_PROVIDER_SITE_OTHER): Payer: BC Managed Care – PPO | Admitting: Medical

## 2021-11-23 ENCOUNTER — Encounter: Payer: Self-pay | Admitting: Medical

## 2021-11-23 VITALS — BP 110/70 | HR 98 | Temp 98.9°F | Wt 147.2 lb

## 2021-11-23 DIAGNOSIS — R6883 Chills (without fever): Secondary | ICD-10-CM | POA: Diagnosis not present

## 2021-11-23 DIAGNOSIS — R253 Fasciculation: Secondary | ICD-10-CM

## 2021-11-23 DIAGNOSIS — R110 Nausea: Secondary | ICD-10-CM

## 2021-11-23 NOTE — Progress Notes (Signed)
Subjective:  Alexis Carpenter is a 41 y.o. female who presents for Chief Complaint  Patient presents with   not feeling well    Not feeling well, joint aches, chills, no fever. Went to UC on 11/20/21 and was negaitve for all FLU, RSV, covid. Neg for tick bites, still having joint aches and chills. Feeling some better but still not herself     Here for unusual symptoms.  4 days ago started feeling sick.  Had chills, muscle twitching, sometimes twitches in toes, legs, hands.  No fever.  One night had nausea, some loose stools, but no vomiting.   Was feeling dehydrated, some headaches.  No head pressure, no congestion, no palpitations.  She woke up with a bug bite on her right anterior thigh several days ago but no redness or rash otherwise.  Not sure what bit her.  She went to urgent care for the symptoms the next day after symptoms began.  Had several labs.  She was tested for Lyme disease, tick fever, flu, COVID, had electrolytes and blood counts as well.  All labs were reportedly normal.  She is still having some chills, some finger twitching.  She drinks alcohol few drinks on the weekend  No sick contacts.  She has history of allergy to mold, bluegrass, dust mites, has Symbicort on hand but has not been using it lately.  She does take some supplements over-the-counter including Hypothalamox, chlorphyll, enzymes, oil of orgegano.  Does not use b12, or Vit D  No other aggravating or relieving factors.    No other c/o.  Past Medical History:  Diagnosis Date   Family history of breast cancer    Family history of cancer of extrahepatic bile ducts    Family history of non-Hodgkin's lymphoma    Status post repeat low transverse cesarean section 07/09/2018   Current Outpatient Medications on File Prior to Visit  Medication Sig Dispense Refill   B Complex-C (B-COMPLEX WITH VITAMIN C) tablet Take 1 tablet by mouth daily.     Multiple Vitamins-Calcium (ONE-A-DAY WOMENS FORMULA PO)  Take by mouth.     albuterol (VENTOLIN HFA) 108 (90 Base) MCG/ACT inhaler Inhale into the lungs as needed. (Patient not taking: Reported on 11/23/2021)     budesonide-formoterol (SYMBICORT) 80-4.5 MCG/ACT inhaler Inhale 2 puffs into the lungs 2 (two) times daily. (Patient not taking: Reported on 11/23/2021)     No current facility-administered medications on file prior to visit.     The following portions of the patient's history were reviewed and updated as appropriate: allergies, current medications, past family history, past medical history, past social history, past surgical history and problem list.  ROS Otherwise as in subjective above  Objective: BP 110/70   Pulse 98   Temp 98.9 F (37.2 C)   Wt 147 lb 3.2 oz (66.8 kg)   BMI 23.76 kg/m   Wt Readings from Last 3 Encounters:  11/23/21 147 lb 3.2 oz (66.8 kg)  10/04/21 150 lb (68 kg)  07/28/21 149 lb (67.6 kg)    General appearance: alert, no distress, well developed, well nourished HEENT: normocephalic, sclerae anicteric, conjunctiva pink and moist, TMs pearly, nares patent, no discharge or erythema, pharynx normal Oral cavity: MMM, no lesions Neck: supple, no lymphadenopathy, no thyromegaly, no masses Heart: RRR, normal S1, S2, no murmurs Lungs: CTA bilaterally, no wheezes, rhonchi, or rales Abdomen: +bs, soft, non tender, non distended, no masses, no hepatomegaly, no splenomegaly Pulses: 2+ radial pulses, 2+ pedal pulses, normal cap  refill Ext: no edema Neuro: CN II through XII intact, nonfocal exam Skin: Right anterior thigh with a small 2 mm reddish coloration but no obvious induration fluctuance or warmth.  No other rash    Assessment: Encounter Diagnoses  Name Primary?   Muscle twitch Yes   Chills    Nausea      Plan: We discussed her symptoms and concerns.  I reviewed recent labs in care everywhere.  Labs were negative for RMSF, Lyme, normal CBC and BMET negative for flu and COVID  Overall she is  quite healthy and no prior underlying history to suggest anything worrisome.  Her current symptoms could just be viral.  We discussed other differential-viral, electrolyte disturbance, vitamin deficiency, other occult neurological change or other.  She does not appear to be ill today.  Her exam is normal today.  We will check some additional labs.  We discussed if there was something more serious underlying neurological then symptoms may manifest over the next several weeks or months.  Alexis Carpenter was seen today for not feeling well.  Diagnoses and all orders for this visit:  Muscle twitch -     TSH -     Vitamin B12 -     VITAMIN D 25 Hydroxy (Vit-D Deficiency, Fractures)  Chills  Nausea    Follow up: Pending labs

## 2021-11-24 ENCOUNTER — Other Ambulatory Visit: Payer: Self-pay | Admitting: Medical

## 2021-11-24 LAB — TSH: TSH: 0.744 u[IU]/mL (ref 0.450–4.500)

## 2021-11-24 LAB — VITAMIN B12: Vitamin B-12: 1134 pg/mL (ref 232–1245)

## 2021-11-24 LAB — VITAMIN D 25 HYDROXY (VIT D DEFICIENCY, FRACTURES): Vit D, 25-Hydroxy: 18.2 ng/mL — ABNORMAL LOW (ref 30.0–100.0)

## 2021-11-24 MED ORDER — VITAMIN D 50 MCG (2000 UT) PO CAPS: 1.0000 | ORAL_CAPSULE | Freq: Every day | ORAL | 0 refills | Status: DC

## 2022-02-16 ENCOUNTER — Other Ambulatory Visit: Payer: Self-pay | Admitting: Medical

## 2022-02-18 NOTE — Telephone Encounter (Signed)
Pt has upcoming appt. Labs done in November

## 2022-02-28 ENCOUNTER — Ambulatory Visit (INDEPENDENT_AMBULATORY_CARE_PROVIDER_SITE_OTHER): Payer: BC Managed Care – PPO | Admitting: Medical

## 2022-02-28 VITALS — BP 114/72 | HR 87 | Ht 66.0 in | Wt 150.8 lb

## 2022-02-28 DIAGNOSIS — R253 Fasciculation: Secondary | ICD-10-CM

## 2022-02-28 DIAGNOSIS — E559 Vitamin D deficiency, unspecified: Secondary | ICD-10-CM

## 2022-02-28 NOTE — Progress Notes (Signed)
Subjective:  Alexis Carpenter is a 42 y.o. female who presents for Chief Complaint  Patient presents with   Med check    Medication management. Pt would like to discuss follow up from last appointment.    Here for follow-up.  I saw her back in November for body twitching..  At that time she was found to have low vitamin D and has been taking vitamin D supplement.  The twitching in her toes and feet has resolved.  She still gets some eye twitching and some occasional twitching of her but for the most part has had much improvement.  Sometimes she feels twitching right before falling asleep.  She also sometimes just feels fragile in general.  But in general doing okay most of the time.  She drinks some alcohol on the weekends maybe 2 glasses of wine.   sleep 7-8 hours daily, awakes refreshsed, Exercise - walks dogs, and uses an app for back and legs and arm exercises.  Periods are regular but sometimes heavy early in the menstrual period.  Rest her menstrual period used to be lighter.  Not on birth control  Drinks a good amount of water daily  No other aggravating or relieving factors.    No other c/o.  Past Medical History:  Diagnosis Date   Family history of breast cancer    Family history of cancer of extrahepatic bile ducts    Family history of non-Hodgkin's lymphoma    Status post repeat low transverse cesarean section 07/09/2018   Current Outpatient Medications on File Prior to Visit  Medication Sig Dispense Refill   Cholecalciferol (VITAMIN D3) 50 MCG (2000 UT) capsule TAKE 1 CAPSULE BY MOUTH EVERY DAY 90 capsule 1   Misc Natural Products (IMMUNE FORMULA PO) Take 2 each by mouth daily.     Multiple Vitamins-Calcium (ONE-A-DAY WOMENS FORMULA PO) Take by mouth.     albuterol (VENTOLIN HFA) 108 (90 Base) MCG/ACT inhaler Inhale into the lungs as needed. (Patient not taking: Reported on 11/23/2021)     B Complex-C (B-COMPLEX WITH VITAMIN C) tablet Take 1 tablet by mouth daily.  (Patient not taking: Reported on 02/28/2022)     budesonide-formoterol (SYMBICORT) 80-4.5 MCG/ACT inhaler Inhale 2 puffs into the lungs 2 (two) times daily. (Patient not taking: Reported on 11/23/2021)     No current facility-administered medications on file prior to visit.     The following portions of the patient's history were reviewed and updated as appropriate: allergies, current medications, past family history, past medical history, past social history, past surgical history and problem list.  ROS Otherwise as in subjective above  Objective: BP 114/72   Pulse 87   Ht 5' 6"$  (1.676 m)   Wt 150 lb 12.8 oz (68.4 kg)   SpO2 98%   BMI 24.34 kg/m   Wt Readings from Last 3 Encounters:  02/28/22 150 lb 12.8 oz (68.4 kg)  11/23/21 147 lb 3.2 oz (66.8 kg)  10/04/21 150 lb (68 kg)   General appearance: alert, no distress, well developed, well nourished neck: supple, no lymphadenopathy, no thyromegaly, no masses Heart: RRR, normal S1, S2, no murmurs Lungs: CTA bilaterally, no wheezes, rhonchi, or rales Pulses: 2+ radial pulses, 2+ pedal pulses, normal cap refill Ext: no edema Neuro: CN II through XII intact, nonfocal exam   Assessment: Encounter Diagnoses  Name Primary?   Vitamin D deficiency Yes   Muscle twitch       Plan: We discussed her visit concerns from November  2023.  We discussed her symptoms and concerns.  I reviewed recent labs in care everywhere from late 2023.  Labs were negative for RMSF, Lyme, normal CBC and BMET negative for flu and COVID  Overall she is quite healthy and no prior underlying history to suggest anything worrisome.   Updated vit D lab since starting supplement last visit  [name] was seen today for med check.  Diagnoses and all orders for this visit:  Vitamin D deficiency -     VITAMIN D 25 Hydroxy (Vit-D Deficiency, Fractures) -     Cancel: Magnesium -     Magnesium  Muscle twitch    Follow up: Pending labs

## 2022-03-01 LAB — MAGNESIUM: Magnesium: 2.5 mg/dL — ABNORMAL HIGH (ref 1.6–2.3)

## 2022-03-01 LAB — VITAMIN D 25 HYDROXY (VIT D DEFICIENCY, FRACTURES): Vit D, 25-Hydroxy: 42.3 ng/mL (ref 30.0–100.0)

## 2022-03-01 NOTE — Progress Notes (Signed)
Results sent through MyChart

## 2022-04-22 ENCOUNTER — Other Ambulatory Visit: Payer: Self-pay | Admitting: Medical

## 2022-04-22 DIAGNOSIS — G479 Sleep disorder, unspecified: Secondary | ICD-10-CM

## 2022-06-18 ENCOUNTER — Ambulatory Visit (INDEPENDENT_AMBULATORY_CARE_PROVIDER_SITE_OTHER): Payer: BC Managed Care – PPO | Admitting: Neurology

## 2022-06-18 ENCOUNTER — Encounter: Payer: Self-pay | Admitting: Neurology

## 2022-06-18 VITALS — BP 115/71 | HR 82 | Ht 66.0 in | Wt 149.0 lb

## 2022-06-18 DIAGNOSIS — F518 Other sleep disorders not due to a substance or known physiological condition: Secondary | ICD-10-CM

## 2022-06-18 DIAGNOSIS — R0683 Snoring: Secondary | ICD-10-CM | POA: Diagnosis not present

## 2022-06-18 DIAGNOSIS — Z87898 Personal history of other specified conditions: Secondary | ICD-10-CM

## 2022-06-18 DIAGNOSIS — R253 Fasciculation: Secondary | ICD-10-CM | POA: Diagnosis not present

## 2022-06-18 DIAGNOSIS — G253 Myoclonus: Secondary | ICD-10-CM

## 2022-06-18 NOTE — Patient Instructions (Addendum)
I am not fully sure as to how to explain your intermittent muscle twitching.   Thankfully, your neurological exam is normal.   Based on your symptoms and your exam I believe we should look for an underlying organic sleep disorder, such as leg movements at night.   We will do an EEG (brainwave test), which we will schedule. We will call you with the results.   If your Sleep study shows obstructive sleep apnea, we can consider treatment with a CPAP machine or AutoPap machine. Studies have shown that patients with sleep apnea can have an increased frequency of involuntary behaviors at night, including leg movements and twitching.   The risks and ramifications of moderate to severe obstructive sleep apnea or OSA are: Cardiovascular disease, including congestive heart failure, stroke, difficult to control hypertension, arrhythmias, and even type 2 diabetes has been linked to untreated OSA. Sleep apnea causes disruption of sleep and sleep deprivation in most cases, which, in turn, can cause recurrent headaches, problems with memory, mood, concentration, focus, and vigilance. Most people with untreated sleep apnea report excessive daytime sleepiness, which can affect their ability to drive. Please do not drive if you feel sleepy.    Our sleep lab administrative assistant, will call you to schedule your sleep study. If you don't hear back from her by about 2 weeks from now, please feel free to call her at [phone]. This is her direct line and please leave a message with your phone number to call back if you get the voicemail box. She will call back as soon as possible.

## 2022-06-18 NOTE — Progress Notes (Signed)
Subjective:    Patient ID: Alexis Carpenter is a 42 y.o. female.  HPI    Alexis Foley, MD, PhD [facility] [address] [address] [place], [place] [address]  Dear Alexis Carpenter,  I saw your patient, Alexis Carpenter, upon your kind request in my sleep clinic today for initial consultation of her sleep disorder, in particular, leg twitching at night and concern for underlying obstructive sleep apnea.  The patient is unaccompanied today.  As you know, Alexis Carpenter is a 42 year old female with an underlying medical history of allergies, uterine leiomyoma, vitamin D deficiency, and chronic cough, who reports an approximately 6 to 22-month history of intermittent twitching in her muscles, particularly in her legs at night.  She denies any telltale symptoms of restless leg syndrome but has had intermittent twitching in her muscles, no obvious triggers, does seem to be worse at night when she is trying to relax.  She falls asleep often with a sleep start.  She has not fallen, she has not had any weakness, no numbness, no sudden onset of one-sided symptoms, no severe headaches.  She has not been clumsy, no obvious balance issues.  Has had a little bit of weight gain over the past few years but stable overall in the recent past.  She snores per her husband.  Her Epworth sleepiness score is 1 out of 24, fatigue severity score is 29 out of 63.  I reviewed your office note from 11/23/2021.  She had blood work at the time including TSH, B12 and vitamin D.  I reviewed her blood test results.  Her vitamin D was low at 18.2, she was advised to start vitamin D at 2000 units daily.  TSH was normal at 0.744, vitamin B12 level was normal at 1134.  She reports that she had a seizure when she was 19.  She had an EEG with sleep deprivation and had another seizure.  She was placed on seizure medication at the time for about a year and is no longer on the medication, does not recall the  medication, has not had any further seizures, no family history of epilepsy.  She denies recurrent nocturnal or morning headaches, no nocturia.  She feels that she overall sleeps fairly well, bedtime is between 9 and 10 and rise time around 6.  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  She is a non-smoker, she drinks alcohol on the weekends, about 2-3 drinks, drinks limited caffeine, 1 cup of tea per day and 1 cup of decaf coffee per day.  Her Past Medical History Is Significant For: Past Medical History:  Diagnosis Date   Family history of breast cancer    Family history of cancer of extrahepatic bile ducts    Family history of non-Hodgkin's lymphoma    Status post repeat low transverse cesarean section 07/09/2018    Her Past Surgical History Is Significant For: Past Surgical History:  Procedure Laterality Date   CESAREAN SECTION     CESAREAN SECTION N/A 07/09/2018   Procedure: CESAREAN SECTION;  Surgeon: Edwinna Areola, DO;  Location: [facility] ORS;  Service: Obstetrics;  Laterality: N/A;  Herbert Seta, RNFA   DILATION AND EVACUATION N/A 11/02/2015   Procedure: DILATATION AND EVACUATION;  Surgeon: Edwinna Areola, DO;  Location: [facility] ORS;  Service: Gynecology;  Laterality: N/A;   WISDOM TOOTH EXTRACTION      Her Family History Is  Significant For: Family History  Problem Relation Age of Onset   Lymphoma Brother 20       NHL   Lymphoma Maternal Aunt 16       Hodgkin's lymphoma   Breast cancer Maternal Aunt 40   Cancer Maternal Aunt        bile duct cancer dx in her 58s   Breast cancer Maternal Aunt        >50   Lung cancer Maternal Aunt        mother's identical twin sister; smoker   Cervical cancer Maternal Aunt    Lymphoma Maternal Uncle        dx 40s-50s. NHL   Heart attack Maternal Grandfather    Lymphoma Paternal Grandmother        NHL   Diabetes Paternal Grandfather    Pneumonia Paternal Grandfather     Sleep apnea Neg Hx     Her Social History Is Significant For: Social History   Socioeconomic History   Marital status: Married    Spouse name: Not on file   Number of children: Not on file   Years of education: Not on file   Highest education level: Not on file  Occupational History   Not on file  Tobacco Use   Smoking status: Former   Smokeless tobacco: Never  Vaping Use   Vaping Use: Never used  Substance and Sexual Activity   Alcohol use: Yes    Alcohol/week: 6.0 standard drinks of alcohol    Types: 6 Glasses of wine per week   Drug use: No   Sexual activity: Yes  Other Topics Concern   Not on file  Social History Narrative   Not on file   Social Determinants of Health   Financial Resource Strain: Low Risk  (06/25/2018)   Overall Financial Resource Strain (CARDIA)    Difficulty of Paying Living Expenses: Not hard at all  Food Insecurity: No Food Insecurity (06/25/2018)   Hunger Vital Sign    Worried About Running Out of Food in the Last Year: Never true    Ran Out of Food in the Last Year: Never true  Transportation Needs: Unknown (06/25/2018)   PRAPARE - Administrator, Civil Service (Medical): No    Lack of Transportation (Non-Medical): Not on file  Physical Activity: Not on file  Stress: No Stress Concern Present (06/25/2018)   [employer] of Occupational Health - Occupational Stress Questionnaire    Feeling of Stress : Only a little  Social Connections: Not on file    Her Allergies Are:  Allergies  Allergen Reactions   Povidone-Iodine Hives and Other (See Comments)   Betadine [Povidone Iodine] Rash   Latex Itching  :   Her Current Medications Are:  Outpatient Encounter Medications as of 06/18/2022  Medication Sig   albuterol (VENTOLIN HFA) 108 (90 Base) MCG/ACT inhaler Inhale into the lungs as needed.   budesonide-formoterol (SYMBICORT) 80-4.5 MCG/ACT inhaler Inhale 2 puffs into the lungs as needed.   Cholecalciferol (VITAMIN D3) 50  MCG (2000 UT) capsule TAKE 1 CAPSULE BY MOUTH EVERY DAY   Multiple Vitamins-Calcium (ONE-A-DAY WOMENS FORMULA PO) Take by mouth.   B Complex-C (B-COMPLEX WITH VITAMIN C) tablet Take 1 tablet by mouth daily.   Misc Natural Products (IMMUNE FORMULA PO) Take 2 each by mouth daily.   No facility-administered encounter medications on file as of 06/18/2022.  :   Review of Systems:  Out of a complete 14 point review of  systems, all are reviewed and negative with the exception of these symptoms as listed below:   Review of Systems  Neurological:        Pt here for sleep consult Pt snores,little fatigue,few headaches Pt denies hypertension ,sleep study,CPAP machine    ESS:1 FSS:29    Objective:  Neurological Exam  Physical Exam Physical Examination:   Vitals:   06/18/22 1234  BP: 115/71  Pulse: 82    General Examination: The patient is a very pleasant 42 y.o. female in no acute distress. She appears well-developed and well-nourished and well groomed.  Mildly anxious appearing.  HEENT: Normocephalic, atraumatic, pupils are equal, round and reactive to light, extraocular tracking is good without limitation to gaze excursion or nystagmus noted. Hearing is grossly intact. Face is symmetric with normal facial animation. Speech is clear with no dysarthria noted. There is no hypophonia. There is no lip, neck/head, jaw or voice tremor. Neck is supple with full range of passive and active motion. There are no carotid bruits on auscultation. Oropharynx exam reveals: mild mouth dryness, good dental hygiene and mild airway crowding, due to small airway entry.  Tongue protrudes centrally and palate elevates symmetrically.  Neck circumference 13-3/8 inches.  Chest: Clear to auscultation without wheezing, rhonchi or crackles noted.  Heart: S1+S2+0, regular and normal without murmurs, rubs or gallops noted.   Abdomen: Soft, non-tender and non-distended.  Extremities: There is no pitting edema in  the distal lower extremities bilaterally.  Good pedal pulses.  Skin: Warm and dry without trophic changes noted.   Musculoskeletal: exam reveals no obvious joint deformities.   Neurologically:  Mental status: The patient is awake, alert and oriented in all 4 spheres. Her immediate and remote memory, attention, language skills and fund of knowledge are appropriate. There is no evidence of aphasia, agnosia, apraxia or anomia. Speech is clear with normal prosody and enunciation. Thought process is linear. Mood is normal and affect is normal.  Cranial nerves II - XII are as described above under HEENT exam.  Motor exam: Normal bulk, strength and tone is noted.  No drift, no rebound, no postural tremor.  There is no obvious action or resting tremor.  No focal or global atrophy.  No fasciculations noted in arms or legs. Fine motor skills and coordination: Good finger taps, hand movements and rapid alternating patting in both upper extremities, normal foot taps bilaterally in the lower extremities.   Cerebellar testing: No dysmetria or intention tremor. There is no truncal or gait ataxia.  Normal finger-to-nose and normal heel-to-shin.  Reflexes are 2+ throughout including ankles, toes are downgoing bilaterally. Sensory exam: intact to light touch in the upper and lower extremities.  Gait, station and balance: She stands easily. No veering to one side is noted. No leaning to one side is noted. Posture is age-appropriate and stance is narrow based. Gait shows normal stride length and normal pace. No problems turning are noted.  Romberg negative, normal tandem walk. Assessment and Plan:  In summary, Maylea Soria is a very pleasant 42 y.o.-year old female ith an underlying medical history of allergies, uterine leiomyoma, vitamin D deficiency, and chronic cough, who presents for evaluation of her sleep disturbance, particularly twitching of her muscles at night, prior to falling asleep, history is  not telltale for restless leg syndrome and periodic limb movements at night.  She has a remote history of seizures which were isolated, she was on treatment for about a year with seizure medication at age 61, history  is not very supportive of seizures.  We will proceed with a nocturnal polysomnogram as well as a daytime routine EEG through our office.  Neurological exam is nonfocal, she is largely reassured.  She is advised to stay well-hydrated and well rested, limit caffeine and alcohol, we talked about the importance of stress reduction.  In the event that her sleep study shows obstructive sleep apnea, she is encouraged to consider AutoPap therapy.  We will keep her posted as to her test results by phone call for now and plan to follow-up accordingly.   I answered all her questions today and she was in agreement with our plan.  Thank you very much for allowing me to participate in the care of this nice patient. If I can be of any further assistance to you please do not hesitate to call me at [phone].  Sincerely,   Alexis Foley, MD, PhD

## 2022-07-01 ENCOUNTER — Telehealth: Payer: Self-pay | Admitting: Neurology

## 2022-07-01 NOTE — Telephone Encounter (Signed)
[employer] pending faxed notes.  

## 2022-07-03 ENCOUNTER — Ambulatory Visit (INDEPENDENT_AMBULATORY_CARE_PROVIDER_SITE_OTHER): Payer: BC Managed Care – PPO | Admitting: Neurology

## 2022-07-03 DIAGNOSIS — G253 Myoclonus: Secondary | ICD-10-CM

## 2022-07-03 DIAGNOSIS — F518 Other sleep disorders not due to a substance or known physiological condition: Secondary | ICD-10-CM

## 2022-07-03 DIAGNOSIS — R253 Fasciculation: Secondary | ICD-10-CM

## 2022-07-03 DIAGNOSIS — Z87898 Personal history of other specified conditions: Secondary | ICD-10-CM

## 2022-07-03 DIAGNOSIS — R0683 Snoring: Secondary | ICD-10-CM

## 2022-07-10 NOTE — Procedures (Signed)
   HISTORY: 42 year old female with reported history of seizure,  TECHNIQUE:  This is a routine 16 channel EEG recording with one channel devoted to a limited EKG recording.  It was performed during wakefulness, drowsiness and asleep.  Hyperventilation and photic stimulation were performed as activating procedures.  There are minimum muscle and movement artifact noted.  Upon maximum arousal, posterior dominant waking rhythm consistent of rhythmic alpha range activity. Activities are symmetric over the bilateral posterior derivations and attenuated with eye opening.  Photic stimulation did not alter the tracing.  Hyperventilation produced mild/moderate buildup with higher amplitude and the slower activities noted.  During EEG recording, patient developed drowsiness and no deeper stage of sleep was achieved During EEG recording, there was no epileptiform discharge noted.  EKG demonstrate normal sinus rhythm.  CONCLUSION: This is a  normal awake EEG.  There is no electrodiagnostic evidence of epileptiform discharge.  Levert Feinstein, M.D. Ph.D.  [facility] [address] [place], [place] [address] Phone: [phone] Fax:      [phone]

## 2022-08-08 NOTE — Telephone Encounter (Signed)
07/30/22:[id number]-mla 07/02/22 [employer] no auth req via fax form EE

## 2022-10-04 ENCOUNTER — Telehealth (INDEPENDENT_AMBULATORY_CARE_PROVIDER_SITE_OTHER): Payer: BC Managed Care – PPO | Admitting: Medical

## 2022-10-04 ENCOUNTER — Encounter: Payer: Self-pay | Admitting: Medical

## 2022-10-04 VITALS — HR 120 | Temp 101.4°F | Wt 147.0 lb

## 2022-10-04 DIAGNOSIS — R051 Acute cough: Secondary | ICD-10-CM | POA: Diagnosis not present

## 2022-10-04 DIAGNOSIS — R509 Fever, unspecified: Secondary | ICD-10-CM

## 2022-10-04 DIAGNOSIS — R6889 Other general symptoms and signs: Secondary | ICD-10-CM

## 2022-10-04 NOTE — Progress Notes (Signed)
Subjective:     Patient ID: Alexis Carpenter, female   DOB: 1980-02-17, 42 y.o.   MRN: [id number]  This visit type was conducted due to national recommendations for restrictions regarding the COVID-19 Pandemic (e.g. social distancing) in an effort to limit this patient's exposure and mitigate transmission in our community.  Due to their co-morbid illnesses, this patient is at least at moderate risk for complications without adequate follow up.  This format is felt to be most appropriate for this patient at this time.    Documentation for virtual audio and video telecommunications through [facility] encounter:  The patient was located at home. The provider was located in the office. The patient did consent to this visit and is aware of possible charges through their insurance for this visit.  The other persons participating in this telemedicine service were none. Time spent on call was 20 minutes and in review of previous records 20 minutes total.  This virtual service is not related to other E/M service within previous 7 days.   HPI Chief Complaint  Patient presents with   other    Started feeling bad Tuesday, lot of muscle aches, body aches, chills and hot, deep cough deep in chest tested again for covid today also negative, tired and fatigued,    Virtual for not feeling well.  She does 4 days of feeling joint aches, body aches, headache, cold and hot, has had a fever.  Fever was 104 initially but then an hour later was 101.2.  She has had a cough the last few days.  She thinks she has a flu or virus.  She thinks she may have picked it up from her son.  Not congested.  No NVD, some st from cough, ear popped, some sinus pressure, fair amoutn cough today  No shortness of breath or wheezing.  Feels tired.  She already took 2 different COVID test this week that were both negative.  Has been using some ibuprofen.  No UTI symptoms.  No rash.  No recent other sick exposures.  She  was around a lot of people this past weekend at a play at the local theater.  No other aggravating or relieving factors. No other complaint.   Past Medical History:  Diagnosis Date   Family history of breast cancer    Family history of cancer of extrahepatic bile ducts    Family history of non-Hodgkin's lymphoma    Status post repeat low transverse cesarean section 07/09/2018    Review of Systems As in subjective    Objective:   Physical Exam Due to coronavirus pandemic stay at home measures, patient visit was virtual and they were not examined in person.   Pulse (!) 120   Temp (!) 101.4 F (38.6 C)   Wt 147 lb (66.7 kg)   SpO2 98%   BMI 23.73 kg/m    Gen: wd, wn, nad Mildly ill-appearing No shortness of breath or wheezing      Assessment:     Encounter Diagnoses  Name Primary?   Flu-like symptoms Yes   Acute cough    Fever, unspecified fever cause         Plan:     Symptoms suggestive of viral syndrome.  Advise rest, hydration, can use over-the-counter Robitussin DM or TheraFlu for symptoms.  Can alternate Tylenol and ibuprofen for fever and malaise.  Advised if much worse over the weekend such as persistent fever despite analgesics, shortness of breath, or overall worse or week  then go to the emergency department or get reevaluated.  We discussed limitation of virtual consult but no obvious indication for antibiotics or x-ray at this point  Celise was seen today for other.  Diagnoses and all orders for this visit:  Flu-like symptoms  Acute cough  Fever, unspecified fever cause    F/u prn

## 2022-10-09 ENCOUNTER — Other Ambulatory Visit: Payer: Self-pay | Admitting: Medical

## 2022-10-09 ENCOUNTER — Telehealth: Payer: Self-pay | Admitting: Family Medicine

## 2022-10-09 MED ORDER — HYDROCODONE BIT-HOMATROP MBR 5-1.5 MG/5ML PO SOLN: 5.0000 mL | Freq: Three times a day (TID) | ORAL | 0 refills | Status: DC | PRN

## 2022-10-09 NOTE — Telephone Encounter (Signed)
Pt states she is still coughing, has been taking Robitussin DM as instructed around the clock, but still coughing, still getting fevers, hot & cold.  Wants to know if you have any suggestions?

## 2022-10-09 NOTE — Telephone Encounter (Signed)
Pt was notified and states that the cough medication is out of stock and not available for a couple days. Can you send something else in for her

## 2022-10-10 ENCOUNTER — Other Ambulatory Visit: Payer: Self-pay | Admitting: Medical

## 2022-10-10 MED ORDER — PROMETHAZINE-DM 6.25-15 MG/5ML PO SYRP: 5.0000 mL | ORAL_SOLUTION | Freq: Four times a day (QID) | ORAL | 0 refills | Status: DC | PRN

## 2022-10-10 NOTE — Telephone Encounter (Signed)
Pt was notified.  

## 2022-10-10 NOTE — Telephone Encounter (Signed)
Pt was notified but pt wants to know what she can do about the fevers in the evening. She is getting fevers around 101 and the other night it got to 104

## 2022-10-12 ENCOUNTER — Other Ambulatory Visit: Payer: Self-pay | Admitting: Family Medicine

## 2022-10-12 MED ORDER — AZITHROMYCIN 500 MG PO TABS: 500.0000 mg | ORAL_TABLET | Freq: Every day | ORAL | 0 refills | Status: DC

## 2022-10-17 ENCOUNTER — Other Ambulatory Visit: Payer: Self-pay | Admitting: Medical

## 2022-10-17 MED ORDER — BENZONATATE 200 MG PO CAPS: 200.0000 mg | ORAL_CAPSULE | Freq: Three times a day (TID) | ORAL | 0 refills | Status: DC | PRN

## 2022-10-17 MED ORDER — PREDNISONE 20 MG PO TABS: ORAL_TABLET | ORAL | 0 refills | Status: DC

## 2022-10-17 MED ORDER — AZITHROMYCIN 500 MG PO TABS: 500.0000 mg | ORAL_TABLET | Freq: Every day | ORAL | 0 refills | Status: DC

## 2022-12-19 ENCOUNTER — Ambulatory Visit: Payer: BC Managed Care – PPO | Admitting: Medical

## 2022-12-19 VITALS — BP 120/70 | HR 66 | Wt 143.4 lb

## 2022-12-19 DIAGNOSIS — R059 Cough, unspecified: Secondary | ICD-10-CM

## 2022-12-19 DIAGNOSIS — R0600 Dyspnea, unspecified: Secondary | ICD-10-CM

## 2022-12-19 DIAGNOSIS — R0789 Other chest pain: Secondary | ICD-10-CM | POA: Diagnosis not present

## 2022-12-19 DIAGNOSIS — K30 Functional dyspepsia: Secondary | ICD-10-CM

## 2022-12-19 DIAGNOSIS — J45991 Cough variant asthma: Secondary | ICD-10-CM

## 2022-12-19 MED ORDER — OMEPRAZOLE 40 MG PO CPDR: 40.0000 mg | DELAYED_RELEASE_CAPSULE | Freq: Every day | ORAL | 0 refills | Status: DC

## 2022-12-19 NOTE — Progress Notes (Signed)
Subjective:  Alexis Carpenter is a 42 y.o. female who presents for Chief Complaint  Patient presents with   Follow-up    Follow-up on virus from September but still feel she has weight on her chest, like indigestion and acid reflux, more gassy.      Here for follow up.   She was seen 10/04/22 for illness.  Had 2 weeks of illness then, in bed for 2 weeks, ended up using 2 rounds of antibiotic, round of steroid.  Had cough that lingered over a month.   During that time had started to feel inflammation in her chest and having some indigestion.  She notes ongoing symptoms of indigestion, acid reflux, using different bra as even her breasts' weight impair her breathing.  Is avoid foods that trigger reflux.    Sometimes feels tight with deep breathing.  Wears a fit bit, pulse and oxygen levels have been ok per fit bit.   She notes indigestion and reflux but not belching.   Gets early satiety.   Eating too much cause pressure in her chest.  Has been doing some intermittent fasting.   Didn't have these symptoms prior to her getting sick.    Sometimes feels zips of pain in abdomen as well.    No change in bowels.  Has a daily BM.  No constipation, no diarrhea.  Using probiotics.    No urinary changes.  No recent fevers.   No nausea or vomiting.  No recent congestion, sneezing or runny nose but has had some sinus pressure and ear pressure.   If air pods in, ears hurt.  Having some stiff and achy neck.    Cough stopped maybe early November  Sometimes has taken some tums and used some acid reflux pills a few times.  Tums helps some.  Has seen allergist in past and has been on Symbicort for allergy/reactive airway when worse flare ups.  Lately not using Symbicort or albuterol.    Nonsmoker.   Drinks alcohol, a few drinks a few nights per week.    No other aggravating or relieving factors.    No other c/o.  Past Medical History:  Diagnosis Date   Family history of breast cancer    Family  history of cancer of extrahepatic bile ducts    Family history of non-Hodgkin's lymphoma    Status post repeat low transverse cesarean section 07/09/2018   Current Outpatient Medications on File Prior to Visit  Medication Sig Dispense Refill   albuterol (VENTOLIN HFA) 108 (90 Base) MCG/ACT inhaler Inhale into the lungs as needed.     B Complex-C (B-COMPLEX WITH VITAMIN C) tablet Take 1 tablet by mouth daily.     Cholecalciferol (VITAMIN D3) 50 MCG (2000 UT) capsule TAKE 1 CAPSULE BY MOUTH EVERY DAY 90 capsule 1   Misc Natural Products (IMMUNE FORMULA PO) Take 2 each by mouth daily.     Multiple Vitamins-Calcium (ONE-A-DAY WOMENS FORMULA PO) Take by mouth.     budesonide-formoterol (SYMBICORT) 80-4.5 MCG/ACT inhaler Inhale 2 puffs into the lungs as needed. (Patient not taking: Reported on 12/19/2022)     No current facility-administered medications on file prior to visit.    The following portions of the patient's history were reviewed and updated as appropriate: allergies, current medications, past family history, past medical history, past social history, past surgical history and problem list.  ROS Otherwise as in subjective above    Objective: BP 120/70   Pulse 66   Wt 143  lb 6.4 oz (65 kg)   SpO2 97%   BMI 23.15 kg/m   Wt Readings from Last 3 Encounters:  12/19/22 143 lb 6.4 oz (65 kg)  10/04/22 147 lb (66.7 kg)  06/18/22 149 lb (67.6 kg)   General appearance: alert, no distress, well developed, well nourished HEENT: normocephalic, sclerae anicteric, conjunctiva pink and moist, TMs pearly, nares patent, no discharge or erythema, pharynx normal Oral cavity: MMM, no lesions Neck: supple, no lymphadenopathy, no thyromegaly, no masses Heart: RRR, normal S1, S2, no murmurs Lungs: CTA bilaterally, no wheezes, rhonchi, or rales Abdomen: +bs, soft, slightly tender upper abdomen, otherwise non tender, non distended, no masses, no hepatomegaly, no splenomegaly Pulses: 2+ radial  pulses, 2+ pedal pulses, normal cap refill Ext: no edema   Assessment: Encounter Diagnoses  Name Primary?   Indigestion Yes   Cough, unspecified type    Dyspnea, unspecified type    Chest pressure    Cough variant asthma      Plan: We discussed her symptoms and concerns.  She had a prolonged bout of respiratory illness and cough back in late September through early November.  Finally most of those symptoms resolved.  Her newer symptoms sound more related to acid reflux and indigestion.  We will have her go for x-ray to evaluate the lungs given the severity of the cough she had   In the meantime avoid GERD triggers as discussed, limit alcohol, avoid eating within 1 to 2 hours of bedtime, begin trial of omeprazole.  Can use Tums as needed.  She sees asthma and allergy and does have a history of cough variant asthma.  Advise she consider going back on Symbicort for the time being to see if that helps with the dyspnea as well although PFT was normal today.    Alexis Carpenter was seen today for follow-up.  Diagnoses and all orders for this visit:  Indigestion -     DG Chest 2 View; Future  Cough, unspecified type -     DG Chest 2 View; Future  Dyspnea, unspecified type -     Spirometry with Graph -     DG Chest 2 View; Future  Chest pressure -     Spirometry with Graph -     DG Chest 2 View; Future  Cough variant asthma -     Spirometry with Graph -     DG Chest 2 View; Future  Other orders -     omeprazole (PRILOSEC) 40 MG capsule; Take 1 capsule (40 mg total) by mouth daily.    Follow up: pending xray

## 2022-12-19 NOTE — Patient Instructions (Addendum)
Please go to [facility] for your chest xray.   Their hours are 8am - 4:30 pm Monday - Friday.  Take your insurance card with you.  [facility] [phone]  [address] [place], [place] [address] 

## 2023-01-06 ENCOUNTER — Ambulatory Visit
Admission: RE | Admit: 2023-01-06 | Discharge: 2023-01-06 | Disposition: A | Discharge status: Home / Self Care | Payer: BC Managed Care – PPO | Source: Ambulatory Visit | Attending: Medical | Admitting: Medical

## 2023-01-06 DIAGNOSIS — K30 Functional dyspepsia: Secondary | ICD-10-CM

## 2023-01-06 DIAGNOSIS — J45991 Cough variant asthma: Secondary | ICD-10-CM

## 2023-01-06 DIAGNOSIS — R0789 Other chest pain: Secondary | ICD-10-CM

## 2023-01-06 DIAGNOSIS — R0600 Dyspnea, unspecified: Secondary | ICD-10-CM

## 2023-01-06 DIAGNOSIS — R059 Cough, unspecified: Secondary | ICD-10-CM

## 2023-01-14 ENCOUNTER — Other Ambulatory Visit: Payer: Self-pay | Admitting: Medical

## 2023-03-04 ENCOUNTER — Encounter: Payer: Self-pay | Admitting: Internal Medicine
